# Patient Record
Sex: Female | Born: 1975 | Race: Black or African American | Hispanic: No | Marital: Single | Smoking: Never smoker
Health system: Southern US, Community
[De-identification: ages and names within clinical notes are randomized; demographics above are authoritative.]

## PROBLEM LIST (undated history)

## (undated) DIAGNOSIS — Z789 Other specified health status: Secondary | ICD-10-CM

## (undated) DIAGNOSIS — A630 Anogenital (venereal) warts: Secondary | ICD-10-CM

## (undated) DIAGNOSIS — D62 Acute posthemorrhagic anemia: Secondary | ICD-10-CM

## (undated) DIAGNOSIS — B019 Varicella without complication: Secondary | ICD-10-CM

## (undated) HISTORY — PX: NO PAST SURGERIES: SHX2092

## (undated) HISTORY — DX: Varicella without complication: B01.9

---

## 1999-05-11 ENCOUNTER — Other Ambulatory Visit: Admission: RE | Admit: 1999-05-11 | Discharge: 1999-05-11 | Payer: Self-pay | Admitting: Gynecology

## 2000-06-20 ENCOUNTER — Other Ambulatory Visit: Admission: RE | Admit: 2000-06-20 | Discharge: 2000-06-20 | Payer: Self-pay | Admitting: Emergency Medicine

## 2010-12-20 ENCOUNTER — Other Ambulatory Visit: Payer: Self-pay | Admitting: Family Medicine

## 2010-12-20 ENCOUNTER — Ambulatory Visit
Admission: RE | Admit: 2010-12-20 | Discharge: 2010-12-20 | Disposition: A | Discharge status: Home / Self Care | Payer: PRIVATE HEALTH INSURANCE | Source: Ambulatory Visit | Attending: Family Medicine | Admitting: Family Medicine

## 2010-12-20 DIAGNOSIS — E0590 Thyrotoxicosis, unspecified without thyrotoxic crisis or storm: Secondary | ICD-10-CM

## 2011-01-02 ENCOUNTER — Other Ambulatory Visit: Payer: Self-pay | Admitting: Internal Medicine

## 2011-01-02 DIAGNOSIS — E0590 Thyrotoxicosis, unspecified without thyrotoxic crisis or storm: Secondary | ICD-10-CM

## 2011-01-17 ENCOUNTER — Encounter (HOSPITAL_COMMUNITY)
Admission: RE | Admit: 2011-01-17 | Discharge: 2011-01-17 | Disposition: A | Discharge status: Home / Self Care | Payer: PRIVATE HEALTH INSURANCE | Source: Ambulatory Visit | Attending: Internal Medicine | Admitting: Internal Medicine

## 2011-01-17 DIAGNOSIS — E0590 Thyrotoxicosis, unspecified without thyrotoxic crisis or storm: Secondary | ICD-10-CM

## 2011-01-17 DIAGNOSIS — E0520 Thyrotoxicosis with toxic multinodular goiter without thyrotoxic crisis or storm: Secondary | ICD-10-CM | POA: Insufficient documentation

## 2011-01-18 ENCOUNTER — Encounter (HOSPITAL_COMMUNITY)
Admission: RE | Admit: 2011-01-18 | Discharge: 2011-01-18 | Disposition: A | Discharge status: Home / Self Care | Payer: PRIVATE HEALTH INSURANCE | Source: Ambulatory Visit | Attending: Internal Medicine | Admitting: Internal Medicine

## 2011-01-18 MED ORDER — SODIUM PERTECHNETATE TC 99M INJECTION
10.0000 | Freq: Once | INTRAVENOUS | Status: AC | PRN
  Administered 2011-01-18: 10 via INTRAVENOUS

## 2011-01-18 MED ORDER — SODIUM IODIDE I 131 CAPSULE
9.0000 | Freq: Once | INTRAVENOUS | Status: AC | PRN
  Administered 2011-01-18: 9 via ORAL

## 2014-04-24 LAB — OB RESULTS CONSOLE ABO/RH: RH TYPE: NEGATIVE

## 2014-04-24 LAB — OB RESULTS CONSOLE HIV ANTIBODY (ROUTINE TESTING): HIV: NONREACTIVE

## 2014-05-13 LAB — OB RESULTS CONSOLE GC/CHLAMYDIA
Chlamydia: NEGATIVE
GC PROBE AMP, GENITAL: NEGATIVE

## 2014-09-14 LAB — OB RESULTS CONSOLE ANTIBODY SCREEN: ANTIBODY SCREEN: NEGATIVE

## 2014-09-14 LAB — OB RESULTS CONSOLE HEPATITIS B SURFACE ANTIGEN: Hepatitis B Surface Ag: NEGATIVE

## 2014-09-14 LAB — OB RESULTS CONSOLE HIV ANTIBODY (ROUTINE TESTING)
HIV: NONREACTIVE
HIV: NONREACTIVE
HIV: NONREACTIVE

## 2014-09-14 LAB — OB RESULTS CONSOLE RPR
RPR: NONREACTIVE
RPR: NONREACTIVE
RPR: NONREACTIVE
RPR: NONREACTIVE

## 2014-09-14 LAB — OB RESULTS CONSOLE RUBELLA ANTIBODY, IGM: RUBELLA: IMMUNE

## 2014-10-26 LAB — OB RESULTS CONSOLE GBS: GBS: NEGATIVE

## 2014-11-26 LAB — OB RESULTS CONSOLE GBS: GBS: NEGATIVE

## 2014-11-30 ENCOUNTER — Encounter (HOSPITAL_COMMUNITY): Payer: Self-pay | Admitting: *Deleted

## 2014-11-30 ENCOUNTER — Inpatient Hospital Stay (HOSPITAL_COMMUNITY)
Admission: AD | Admit: 2014-11-30 | Discharge: 2014-12-04 | DRG: 765 | Disposition: A | Discharge status: Home / Self Care | Payer: BLUE CROSS/BLUE SHIELD | Source: Ambulatory Visit | Attending: Obstetrics and Gynecology | Admitting: Obstetrics and Gynecology

## 2014-11-30 ENCOUNTER — Inpatient Hospital Stay (HOSPITAL_COMMUNITY): Payer: BLUE CROSS/BLUE SHIELD | Admitting: Anesthesiology

## 2014-11-30 ENCOUNTER — Inpatient Hospital Stay (HOSPITAL_COMMUNITY)
Admission: AD | Admit: 2014-11-30 | Discharge: 2014-11-30 | Disposition: A | Discharge status: Home / Self Care | Payer: BLUE CROSS/BLUE SHIELD | Source: Ambulatory Visit | Attending: Obstetrics and Gynecology | Admitting: Obstetrics and Gynecology

## 2014-11-30 DIAGNOSIS — E860 Dehydration: Secondary | ICD-10-CM | POA: Diagnosis present

## 2014-11-30 DIAGNOSIS — O09513 Supervision of elderly primigravida, third trimester: Secondary | ICD-10-CM

## 2014-11-30 DIAGNOSIS — Z3A39 39 weeks gestation of pregnancy: Secondary | ICD-10-CM | POA: Diagnosis present

## 2014-11-30 DIAGNOSIS — O479 False labor, unspecified: Secondary | ICD-10-CM

## 2014-11-30 DIAGNOSIS — O26899 Other specified pregnancy related conditions, unspecified trimester: Secondary | ICD-10-CM | POA: Diagnosis present

## 2014-11-30 DIAGNOSIS — Z6791 Unspecified blood type, Rh negative: Secondary | ICD-10-CM

## 2014-11-30 DIAGNOSIS — O99284 Endocrine, nutritional and metabolic diseases complicating childbirth: Secondary | ICD-10-CM | POA: Diagnosis present

## 2014-11-30 DIAGNOSIS — O360930 Maternal care for other rhesus isoimmunization, third trimester, not applicable or unspecified: Secondary | ICD-10-CM | POA: Diagnosis present

## 2014-11-30 DIAGNOSIS — O9902 Anemia complicating childbirth: Secondary | ICD-10-CM | POA: Diagnosis not present

## 2014-11-30 DIAGNOSIS — Z3490 Encounter for supervision of normal pregnancy, unspecified, unspecified trimester: Secondary | ICD-10-CM

## 2014-11-30 DIAGNOSIS — D62 Acute posthemorrhagic anemia: Secondary | ICD-10-CM | POA: Diagnosis not present

## 2014-11-30 HISTORY — DX: Anogenital (venereal) warts: A63.0

## 2014-11-30 HISTORY — DX: Other specified health status: Z78.9

## 2014-11-30 HISTORY — DX: Acute posthemorrhagic anemia: D62

## 2014-11-30 LAB — POCT FERN TEST

## 2014-11-30 LAB — TYPE AND SCREEN
ABO/RH(D): B NEG
Antibody Screen: NEGATIVE

## 2014-11-30 LAB — CBC
HCT: 35.6 % — ABNORMAL LOW (ref 36.0–46.0)
Hemoglobin: 12.4 g/dL (ref 12.0–15.0)
MCH: 33.8 pg (ref 26.0–34.0)
MCHC: 34.8 g/dL (ref 30.0–36.0)
MCV: 97 fL (ref 78.0–100.0)
PLATELETS: 226 10*3/uL (ref 150–400)
RBC: 3.67 MIL/uL — AB (ref 3.87–5.11)
RDW: 13.4 % (ref 11.5–15.5)
WBC: 14.7 10*3/uL — AB (ref 4.0–10.5)

## 2014-11-30 LAB — ABO/RH: ABO/RH(D): B NEG

## 2014-11-30 MED ORDER — CITRIC ACID-SODIUM CITRATE 334-500 MG/5ML PO SOLN
30.0000 mL | ORAL | Status: DC | PRN
  Administered 2014-11-30 – 2014-12-01 (×2): 30 mL via ORAL
  Filled 2014-11-30 (×2): qty 15

## 2014-11-30 MED ORDER — LACTATED RINGERS IV SOLN
500.0000 mL | INTRAVENOUS | Status: DC | PRN
  Administered 2014-11-30: 1000 mL via INTRAVENOUS
  Administered 2014-11-30 – 2014-12-01 (×2): 500 mL via INTRAVENOUS

## 2014-11-30 MED ORDER — LIDOCAINE HCL (PF) 1 % IJ SOLN
INTRAMUSCULAR | Status: DC | PRN
  Administered 2014-11-30 (×2): 3 mL

## 2014-11-30 MED ORDER — TERBUTALINE SULFATE 1 MG/ML IJ SOLN: 0.2500 mg | Freq: Once | INTRAMUSCULAR | Status: AC | PRN

## 2014-11-30 MED ORDER — LACTATED RINGERS IV SOLN
INTRAVENOUS | Status: DC
  Administered 2014-11-30: 21:00:00 via INTRAUTERINE

## 2014-11-30 MED ORDER — PHENYLEPHRINE 40 MCG/ML (10ML) SYRINGE FOR IV PUSH (FOR BLOOD PRESSURE SUPPORT)
80.0000 ug | PREFILLED_SYRINGE | INTRAVENOUS | Status: DC | PRN
  Filled 2014-11-30 (×3): qty 20

## 2014-11-30 MED ORDER — LIDOCAINE HCL (PF) 1 % IJ SOLN: 30.0000 mL | INTRAMUSCULAR | Status: DC | PRN

## 2014-11-30 MED ORDER — ACETAMINOPHEN 325 MG PO TABS: 650.0000 mg | ORAL_TABLET | ORAL | Status: DC | PRN

## 2014-11-30 MED ORDER — DIPHENHYDRAMINE HCL 50 MG/ML IJ SOLN: 12.5000 mg | INTRAMUSCULAR | Status: DC | PRN

## 2014-11-30 MED ORDER — LACTATED RINGERS IV SOLN
INTRAVENOUS | Status: DC
  Administered 2014-11-30 – 2014-12-01 (×3): via INTRAVENOUS

## 2014-11-30 MED ORDER — FLEET ENEMA 7-19 GM/118ML RE ENEM: 1.0000 | ENEMA | Freq: Every day | RECTAL | Status: DC | PRN

## 2014-11-30 MED ORDER — SODIUM BICARBONATE 8.4 % IV SOLN
INTRAVENOUS | Status: DC | PRN
  Administered 2014-11-30: 3 mL via EPIDURAL
  Administered 2014-11-30: 7 mL via EPIDURAL
  Administered 2014-11-30: 5 mL via EPIDURAL
  Administered 2014-11-30: 4 mL via EPIDURAL
  Administered 2014-11-30 – 2014-12-01 (×3): 5 mL via EPIDURAL

## 2014-11-30 MED ORDER — ACETAMINOPHEN-CODEINE #3 300-30 MG PO TABS
2.0000 | ORAL_TABLET | Freq: Once | ORAL | Status: AC
  Administered 2014-11-30: 2 via ORAL
  Filled 2014-11-30: qty 2

## 2014-11-30 MED ORDER — FENTANYL 2.5 MCG/ML BUPIVACAINE 1/10 % EPIDURAL INFUSION (WH - ANES)
13.0000 mL/h | INTRAMUSCULAR | Status: DC | PRN
Start: 2014-11-30 — End: 2014-12-01
  Administered 2014-11-30 (×4): 13 mL/h via EPIDURAL
  Filled 2014-11-30 (×2): qty 125

## 2014-11-30 MED ORDER — LACTATED RINGERS IV BOLUS (SEPSIS): 1000.0000 mL | Freq: Once | INTRAVENOUS | Status: DC

## 2014-11-30 MED ORDER — OXYCODONE-ACETAMINOPHEN 5-325 MG PO TABS: 1.0000 | ORAL_TABLET | ORAL | Status: DC | PRN

## 2014-11-30 MED ORDER — FENTANYL 2.5 MCG/ML BUPIVACAINE 1/10 % EPIDURAL INFUSION (WH - ANES)
14.0000 mL/h | INTRAMUSCULAR | Status: DC | PRN
  Filled 2014-11-30: qty 125

## 2014-11-30 MED ORDER — OXYTOCIN 40 UNITS IN LACTATED RINGERS INFUSION - SIMPLE MED: 62.5000 mL/h | INTRAVENOUS | Status: DC

## 2014-11-30 MED ORDER — EPHEDRINE 5 MG/ML INJ: 10.0000 mg | INTRAVENOUS | Status: DC | PRN

## 2014-11-30 MED ORDER — OXYTOCIN 40 UNITS IN LACTATED RINGERS INFUSION - SIMPLE MED
1.0000 m[IU]/min | INTRAVENOUS | Status: DC
Start: 2014-11-30 — End: 2014-12-01
  Administered 2014-11-30: 2 m[IU]/min via INTRAVENOUS
  Administered 2014-11-30: 4 m[IU]/min via INTRAVENOUS
  Administered 2014-11-30: 12 m[IU]/min via INTRAVENOUS
  Filled 2014-11-30: qty 1000

## 2014-11-30 MED ORDER — PHENYLEPHRINE 40 MCG/ML (10ML) SYRINGE FOR IV PUSH (FOR BLOOD PRESSURE SUPPORT)
80.0000 ug | PREFILLED_SYRINGE | INTRAVENOUS | Status: AC | PRN
  Administered 2014-11-30 – 2014-12-01 (×6): 80 ug via INTRAVENOUS

## 2014-11-30 MED ORDER — OXYTOCIN BOLUS FROM INFUSION: 500.0000 mL | INTRAVENOUS | Status: DC

## 2014-11-30 MED ORDER — OXYCODONE-ACETAMINOPHEN 5-325 MG PO TABS: 2.0000 | ORAL_TABLET | ORAL | Status: DC | PRN

## 2014-11-30 MED ORDER — ONDANSETRON HCL 4 MG/2ML IJ SOLN
4.0000 mg | Freq: Four times a day (QID) | INTRAMUSCULAR | Status: DC | PRN
  Administered 2014-11-30 – 2014-12-01 (×2): 4 mg via INTRAVENOUS
  Filled 2014-11-30: qty 2

## 2014-11-30 NOTE — Discharge Instructions (Signed)

## 2014-11-30 NOTE — Anesthesia Procedure Notes (Addendum)
Epidural Patient location during procedure: OB Start time: 11/30/2014 11:07 AM  Staffing Anesthesiologist: Mal Amabile Performed by: anesthesiologist   Preanesthetic Checklist Completed: patient identified, site marked, surgical consent, pre-op evaluation, timeout performed, IV checked, risks and benefits discussed and monitors and equipment checked  Epidural Patient position: sitting Prep: site prepped and draped and DuraPrep Patient monitoring: continuous pulse ox and blood pressure Approach: midline Location: L3-L4 Injection technique: LOR air  Needle:  Needle type: Tuohy  Needle gauge: 17 G Needle length: 9 cm and 9 Needle insertion depth: 5 cm cm Catheter type: closed end flexible Catheter size: 19 Gauge Catheter at skin depth: 10 cm Test dose: negative and Other  Assessment Events: blood not aspirated, injection not painful, no injection resistance, negative IV test and no paresthesia  Additional Notes Patient identified. Risks and benefits discussed including failed block, incomplete  Pain control, post dural puncture headache, nerve damage, paralysis, blood pressure Changes, nausea, vomiting, reactions to medications-both toxic and allergic and post Partum back pain. All questions were answered. Patient expressed understanding and wished to proceed. Sterile technique was used throughout procedure. Epidural site was Dressed with sterile barrier dressing. No paresthesias, signs of intravascular injection Or signs of intrathecal spread were encountered.  Patient was more comfortable after the epidural was dosed. Please see RN's note for documentation of vital signs and FHR which are stable.   Epidural Patient location during procedure: OB Start time: 11/30/2014 7:40 PM  Staffing Anesthesiologist: Mal Amabile Performed by: anesthesiologist   Preanesthetic Checklist Completed: patient identified, site marked, surgical consent, pre-op evaluation, timeout  performed, IV checked, risks and benefits discussed and monitors and equipment checked  Epidural Patient position: sitting Prep: site prepped and draped and DuraPrep Patient monitoring: continuous pulse ox and blood pressure Approach: midline Location: L4-L5 Injection technique: LOR air  Needle:  Needle type: Tuohy  Needle gauge: 17 G Needle length: 9 cm and 9 Needle insertion depth: 6 cm Catheter type: closed end flexible Catheter size: 19 Gauge Catheter at skin depth: 11 cm Test dose: negative and Other  Assessment Events: blood not aspirated, injection not painful, no injection resistance, negative IV test and no paresthesia  Additional Notes Patient identified. Risks and benefits discussed including failed block, incomplete  Pain control, post dural puncture headache, nerve damage, paralysis, blood pressure Changes, nausea, vomiting, reactions to medications-both toxic and allergic and post Partum back pain. All questions were answered. Patient expressed understanding and wished to proceed. Sterile technique was used throughout procedure. Epidural site was Dressed with sterile barrier dressing. No paresthesias, signs of intravascular injection Or signs of intrathecal spread were encountered.  Patient was more comfortable after the epidural was dosed. Please see RN's note for documentation of vital signs and FHR which are stable.

## 2014-11-30 NOTE — Progress Notes (Signed)
S; epidural re-dosed Pt more comfortable   O:  BP 83/61 FHR deceleration noted down to 70's -80's x 4 mins . Baseline 135 Maternal positional change and mat O2 given  VE loose 4/edemeatous ant lip/-2  ? caput  Bedside sono vtx AROM clear fluid ISE/IUPC placed  IMP: Arrest of dilation 2nd to suboptimal ctx Dehydration Term gestation P) left exaggerated sims. Cont pitocin. Watch tracing closely. May need amnioinfusion

## 2014-11-30 NOTE — Progress Notes (Signed)
Juliane Lack CRNA at bedside to evaluate pt

## 2014-11-30 NOTE — Progress Notes (Signed)
Epidural redosed with 3 cc of 2% Lidocaine

## 2014-11-30 NOTE — Progress Notes (Signed)
Epidural redosed 

## 2014-11-30 NOTE — H&P (Signed)
Renee Drake is a 39 y.o. female presenting @ term in labor. Intact membrane. GBS cx neg Came from MAU where she was evaluated for ctx/labor History OB History    Gravida Para Term Preterm AB TAB SAB Ectopic Multiple Living   1 0 0       0     Past Medical History  Diagnosis Date  . Medical history non-contributory   . HPV (human papilloma virus) anogenital infection    Past Surgical History  Procedure Laterality Date  . No past surgeries     Family History: family history includes Diabetes in her mother. Social History:  reports that she has never smoked. She has never used smokeless tobacco. She reports that she does not drink alcohol or use illicit drugs.   Prenatal Transfer Tool  Maternal Diabetes: No Genetic Screening: Normal Maternal Ultrasounds/Referrals: Normal Fetal Ultrasounds or other Referrals:  None Maternal Substance Abuse:  No Significant Maternal Medications:  None Significant Maternal Lab Results:  Lab values include: Group B Strep negative, Rh negative Other Comments:  None  Review of Systems  All other systems reviewed and are negative.   Dilation: 4 Exam by:: s grindsstaff rn Blood pressure 112/60, pulse 87, temperature 97.5 F (36.4 C), temperature source Oral, resp. rate 17, height 5\' 3"  (1.6 m), weight 81.194 kg (179 lb), SpO2 100 %. Maternal Exam:  Uterine Assessment: Contraction strength is mild.  Abdomen: Patient reports no abdominal tenderness.   Physical Exam  Constitutional: She is oriented to person, place, and time. She appears well-developed and well-nourished.  HENT:  Head: Atraumatic.  Eyes: EOM are normal.  Neck: Neck supple.  Cardiovascular: Normal rate.   Respiratory: Breath sounds normal.  GI: Soft.  Neurological: She is alert and oriented to person, place, and time.  Skin: Skin is warm and dry.  Psychiatric: She has a normal mood and affect.   VE 4/90/-3/-2 intact  Tracing: baseline 150 (+) accel  Ctx q 4-6 mins    Prenatal labs: ABO, Rh: --/--/B NEG, B NEG (06/13 1035) Antibody: NEG (06/13 1035) Rubella: Immune (03/28 0000) RPR: Nonreactive, Nonreactive, Nonreactive, Nonreactive (03/28 0000)  HBsAg: Negative (03/28 0000)  HIV: Non-reactive, Non-reactive, Non-reactive (03/28 0000)  GBS: Negative (06/09 0000)   Assessment/Plan  Early labor Rh negative Term gestation P) admit routine labs Epidural amniotomy IVF, pitocin augmentation. rhophylac prn  Renee Drake A 11/30/2014, 1:40 PM

## 2014-11-30 NOTE — Progress Notes (Signed)
Valora Corporal, RROB RN at bedside to monitor baby during epidural placement.

## 2014-11-30 NOTE — Progress Notes (Signed)
Patient desires to sit in high fowlers due to reflux and requests meds, oracit given, see MAR

## 2014-11-30 NOTE — Progress Notes (Signed)
Tracing reviewed: Deep variable decels noted in otherwise tracing with accels  IMP: variable deceleration due to cord compression P) start amnioinfusion Cont pitocin

## 2014-11-30 NOTE — Progress Notes (Signed)
Bladder scan 0 cc of urine in bladder Pt also placed on bedpan.  Pt unable to void

## 2014-11-30 NOTE — Progress Notes (Signed)
Pt in pain after three doses of PCEA. Dr Arby Barrette and CRNA notified.  CRNA will be coming to room to evalutate pt.  Pt alos c/o feeling like she needs to "pee"  Balloon on catheter deflated and inserted further into bladder.  Appriox 150 cc of urine in foley bag

## 2014-11-30 NOTE — MAU Note (Signed)
Pt reports she has been contracting since Thursday but this morning they are back to back. reports having some bloody show and has lost her mucus plug.Good fetal movement reported.

## 2014-11-30 NOTE — Anesthesia Preprocedure Evaluation (Addendum)
Anesthesia Evaluation  Patient identified by MRN, date of birth, ID band Patient awake    Reviewed: Allergy & Precautions, NPO status , Patient's Chart, lab work & pertinent test results  Airway Mallampati: III  TM Distance: >3 FB Neck ROM: Full    Dental no notable dental hx. (+) Teeth Intact   Pulmonary neg pulmonary ROS,  breath sounds clear to auscultation  Pulmonary exam normal       Cardiovascular negative cardio ROS Normal cardiovascular examRhythm:Regular Rate:Normal     Neuro/Psych negative neurological ROS  negative psych ROS   GI/Hepatic Neg liver ROS, GERD-  Medicated and Controlled,  Endo/Other  Obesity  Renal/GU negative Renal ROS  negative genitourinary   Musculoskeletal negative musculoskeletal ROS (+)   Abdominal (+) + obese,   Peds  Hematology   Anesthesia Other Findings   Reproductive/Obstetrics (+) Pregnancy HPV                             Anesthesia Physical Anesthesia Plan  ASA: II and emergent  Anesthesia Plan: Epidural   Post-op Pain Management:    Induction:   Airway Management Planned: Natural Airway  Additional Equipment:   Intra-op Plan:   Post-operative Plan:   Informed Consent: I have reviewed the patients History and Physical, chart, labs and discussed the procedure including the risks, benefits and alternatives for the proposed anesthesia with the patient or authorized representative who has indicated his/her understanding and acceptance.   Dental advisory given  Plan Discussed with: Anesthesiologist, CRNA and Surgeon  Anesthesia Plan Comments: (Patient for urgent C/Section for non reassuring FHR tracing. Will use epidural for C/Section. M. Juwan Vences,MD)       Anesthesia Quick Evaluation

## 2014-12-01 ENCOUNTER — Encounter (HOSPITAL_COMMUNITY)
Admission: AD | Disposition: A | Discharge status: Home / Self Care | Payer: Self-pay | Source: Ambulatory Visit | Attending: Obstetrics and Gynecology

## 2014-12-01 ENCOUNTER — Encounter (HOSPITAL_COMMUNITY): Payer: Self-pay | Admitting: *Deleted

## 2014-12-01 LAB — RPR: RPR Ser Ql: NONREACTIVE

## 2014-12-01 SURGERY — Surgical Case
Anesthesia: Epidural | Site: Abdomen

## 2014-12-01 MED ORDER — MORPHINE SULFATE (PF) 0.5 MG/ML IJ SOLN
INTRAMUSCULAR | Status: DC | PRN
  Administered 2014-12-01: 4 mg via EPIDURAL

## 2014-12-01 MED ORDER — OXYCODONE-ACETAMINOPHEN 5-325 MG PO TABS
2.0000 | ORAL_TABLET | ORAL | Status: DC | PRN
Start: 2014-12-01 — End: 2014-12-04

## 2014-12-01 MED ORDER — METHYLERGONOVINE MALEATE 0.2 MG/ML IJ SOLN: 0.2000 mg | INTRAMUSCULAR | Status: DC | PRN

## 2014-12-01 MED ORDER — OXYTOCIN 10 UNIT/ML IJ SOLN
40.0000 [IU] | INTRAVENOUS | Status: DC | PRN
  Administered 2014-12-01: 40 [IU] via INTRAVENOUS

## 2014-12-01 MED ORDER — SIMETHICONE 80 MG PO CHEW
80.0000 mg | CHEWABLE_TABLET | Freq: Three times a day (TID) | ORAL | Status: DC
  Administered 2014-12-01 – 2014-12-04 (×9): 80 mg via ORAL
  Filled 2014-12-01 (×10): qty 1

## 2014-12-01 MED ORDER — BUPIVACAINE HCL (PF) 0.25 % IJ SOLN
INTRAMUSCULAR | Status: AC
  Filled 2014-12-01: qty 30

## 2014-12-01 MED ORDER — LANOLIN HYDROUS EX OINT: 1.0000 "application " | TOPICAL_OINTMENT | CUTANEOUS | Status: DC | PRN

## 2014-12-01 MED ORDER — PHENYLEPHRINE 40 MCG/ML (10ML) SYRINGE FOR IV PUSH (FOR BLOOD PRESSURE SUPPORT)
PREFILLED_SYRINGE | INTRAVENOUS | Status: AC
  Filled 2014-12-01: qty 30

## 2014-12-01 MED ORDER — NALBUPHINE HCL 10 MG/ML IJ SOLN: 5.0000 mg | INTRAMUSCULAR | Status: DC | PRN

## 2014-12-01 MED ORDER — NALOXONE HCL 0.4 MG/ML IJ SOLN: 0.4000 mg | INTRAMUSCULAR | Status: DC | PRN

## 2014-12-01 MED ORDER — SCOPOLAMINE 1 MG/3DAYS TD PT72
MEDICATED_PATCH | TRANSDERMAL | Status: AC
  Filled 2014-12-01: qty 1

## 2014-12-01 MED ORDER — METHYLERGONOVINE MALEATE 0.2 MG/ML IJ SOLN
INTRAMUSCULAR | Status: DC | PRN
  Administered 2014-12-01: 0.2 mg via INTRAMUSCULAR

## 2014-12-01 MED ORDER — DEXAMETHASONE SODIUM PHOSPHATE 4 MG/ML IJ SOLN
INTRAMUSCULAR | Status: AC
  Filled 2014-12-01: qty 1

## 2014-12-01 MED ORDER — DIPHENHYDRAMINE HCL 25 MG PO CAPS: 25.0000 mg | ORAL_CAPSULE | ORAL | Status: DC | PRN

## 2014-12-01 MED ORDER — SODIUM CHLORIDE 0.9 % IJ SOLN: 3.0000 mL | INTRAMUSCULAR | Status: DC | PRN

## 2014-12-01 MED ORDER — NALBUPHINE HCL 10 MG/ML IJ SOLN: 5.0000 mg | Freq: Once | INTRAMUSCULAR | Status: AC | PRN

## 2014-12-01 MED ORDER — OXYTOCIN 10 UNIT/ML IJ SOLN
INTRAMUSCULAR | Status: AC
  Filled 2014-12-01: qty 4

## 2014-12-01 MED ORDER — KETOROLAC TROMETHAMINE 30 MG/ML IJ SOLN
30.0000 mg | Freq: Four times a day (QID) | INTRAMUSCULAR | Status: AC | PRN
  Administered 2014-12-01: 30 mg via INTRAVENOUS

## 2014-12-01 MED ORDER — SODIUM CHLORIDE 0.9 % IV SOLN: 250.0000 mL | INTRAVENOUS | Status: DC

## 2014-12-01 MED ORDER — ONDANSETRON HCL 4 MG/2ML IJ SOLN
INTRAMUSCULAR | Status: AC
  Filled 2014-12-01: qty 4

## 2014-12-01 MED ORDER — 0.9 % SODIUM CHLORIDE (POUR BTL) OPTIME
TOPICAL | Status: DC | PRN
  Administered 2014-12-01: 1000 mL

## 2014-12-01 MED ORDER — ONDANSETRON HCL 4 MG/2ML IJ SOLN: 4.0000 mg | Freq: Three times a day (TID) | INTRAMUSCULAR | Status: DC | PRN

## 2014-12-01 MED ORDER — BUPIVACAINE HCL (PF) 0.25 % IJ SOLN
INTRAMUSCULAR | Status: DC | PRN
  Administered 2014-12-01: 10 mL

## 2014-12-01 MED ORDER — SCOPOLAMINE 1 MG/3DAYS TD PT72
MEDICATED_PATCH | TRANSDERMAL | Status: DC | PRN
  Administered 2014-12-01: 1 via TRANSDERMAL

## 2014-12-01 MED ORDER — ACETAMINOPHEN 325 MG PO TABS: 650.0000 mg | ORAL_TABLET | ORAL | Status: DC | PRN

## 2014-12-01 MED ORDER — DIPHENHYDRAMINE HCL 25 MG PO CAPS: 25.0000 mg | ORAL_CAPSULE | Freq: Four times a day (QID) | ORAL | Status: DC | PRN

## 2014-12-01 MED ORDER — IBUPROFEN 600 MG PO TABS
600.0000 mg | ORAL_TABLET | Freq: Four times a day (QID) | ORAL | Status: DC
  Administered 2014-12-02 – 2014-12-04 (×10): 600 mg via ORAL
  Filled 2014-12-01 (×12): qty 1

## 2014-12-01 MED ORDER — LACTATED RINGERS IV SOLN
INTRAVENOUS | Status: DC
  Administered 2014-12-01: 125 mL/h via INTRAVENOUS

## 2014-12-01 MED ORDER — KETOROLAC TROMETHAMINE 30 MG/ML IJ SOLN
INTRAMUSCULAR | Status: AC
  Administered 2014-12-01: 30 mg via INTRAVENOUS
  Filled 2014-12-01: qty 1

## 2014-12-01 MED ORDER — NALBUPHINE HCL 10 MG/ML IJ SOLN
5.0000 mg | Freq: Once | INTRAMUSCULAR | Status: AC | PRN
Start: 2014-12-01 — End: 2014-12-01

## 2014-12-01 MED ORDER — CEFAZOLIN SODIUM-DEXTROSE 2-3 GM-% IV SOLR
2.0000 g | INTRAVENOUS | Status: DC
  Filled 2014-12-01: qty 50

## 2014-12-01 MED ORDER — CEFAZOLIN SODIUM-DEXTROSE 2-3 GM-% IV SOLR
INTRAVENOUS | Status: DC | PRN
  Administered 2014-12-01: 2 g via INTRAVENOUS

## 2014-12-01 MED ORDER — SENNOSIDES-DOCUSATE SODIUM 8.6-50 MG PO TABS
2.0000 | ORAL_TABLET | ORAL | Status: DC
  Administered 2014-12-02 – 2014-12-03 (×3): 2 via ORAL
  Filled 2014-12-01 (×3): qty 2

## 2014-12-01 MED ORDER — WITCH HAZEL-GLYCERIN EX PADS: 1.0000 "application " | MEDICATED_PAD | CUTANEOUS | Status: DC | PRN

## 2014-12-01 MED ORDER — PRENATAL MULTIVITAMIN CH
1.0000 | ORAL_TABLET | Freq: Every day | ORAL | Status: DC
  Administered 2014-12-02 – 2014-12-03 (×2): 1 via ORAL
  Filled 2014-12-01 (×3): qty 1

## 2014-12-01 MED ORDER — FLEET ENEMA 7-19 GM/118ML RE ENEM: 1.0000 | ENEMA | Freq: Every day | RECTAL | Status: DC | PRN

## 2014-12-01 MED ORDER — ZOLPIDEM TARTRATE 5 MG PO TABS: 5.0000 mg | ORAL_TABLET | Freq: Every evening | ORAL | Status: DC | PRN

## 2014-12-01 MED ORDER — SODIUM CHLORIDE 0.9 % IJ SOLN: 3.0000 mL | Freq: Two times a day (BID) | INTRAMUSCULAR | Status: DC

## 2014-12-01 MED ORDER — SIMETHICONE 80 MG PO CHEW
80.0000 mg | CHEWABLE_TABLET | ORAL | Status: DC
  Administered 2014-12-02 – 2014-12-03 (×3): 80 mg via ORAL
  Filled 2014-12-01 (×3): qty 1

## 2014-12-01 MED ORDER — MENTHOL 3 MG MT LOZG: 1.0000 | LOZENGE | OROMUCOSAL | Status: DC | PRN

## 2014-12-01 MED ORDER — OXYCODONE-ACETAMINOPHEN 5-325 MG PO TABS
1.0000 | ORAL_TABLET | ORAL | Status: DC | PRN
  Administered 2014-12-03 (×2): 1 via ORAL
  Filled 2014-12-01 (×4): qty 1

## 2014-12-01 MED ORDER — IBUPROFEN 600 MG PO TABS
600.0000 mg | ORAL_TABLET | Freq: Four times a day (QID) | ORAL | Status: DC | PRN
  Administered 2014-12-01: 600 mg via ORAL

## 2014-12-01 MED ORDER — SIMETHICONE 80 MG PO CHEW: 80.0000 mg | CHEWABLE_TABLET | ORAL | Status: DC | PRN

## 2014-12-01 MED ORDER — OXYTOCIN 40 UNITS IN LACTATED RINGERS INFUSION - SIMPLE MED: 62.5000 mL/h | INTRAVENOUS | Status: AC

## 2014-12-01 MED ORDER — KETOROLAC TROMETHAMINE 30 MG/ML IJ SOLN: 30.0000 mg | Freq: Four times a day (QID) | INTRAMUSCULAR | Status: AC | PRN

## 2014-12-01 MED ORDER — DIBUCAINE 1 % RE OINT: 1.0000 "application " | TOPICAL_OINTMENT | RECTAL | Status: DC | PRN

## 2014-12-01 MED ORDER — DEXAMETHASONE SODIUM PHOSPHATE 4 MG/ML IJ SOLN
INTRAMUSCULAR | Status: DC | PRN
  Administered 2014-12-01: 4 mg via INTRAVENOUS

## 2014-12-01 MED ORDER — FENTANYL CITRATE (PF) 100 MCG/2ML IJ SOLN: 25.0000 ug | INTRAMUSCULAR | Status: DC | PRN

## 2014-12-01 MED ORDER — MEPERIDINE HCL 25 MG/ML IJ SOLN: 6.2500 mg | INTRAMUSCULAR | Status: DC | PRN

## 2014-12-01 MED ORDER — BISACODYL 10 MG RE SUPP: 10.0000 mg | Freq: Every day | RECTAL | Status: DC | PRN

## 2014-12-01 MED ORDER — NALOXONE HCL 1 MG/ML IJ SOLN
1.0000 ug/kg/h | INTRAVENOUS | Status: DC | PRN
  Filled 2014-12-01: qty 2

## 2014-12-01 MED ORDER — MORPHINE SULFATE 0.5 MG/ML IJ SOLN
INTRAMUSCULAR | Status: AC
  Filled 2014-12-01: qty 10

## 2014-12-01 MED ORDER — FERROUS SULFATE 325 (65 FE) MG PO TABS
325.0000 mg | ORAL_TABLET | Freq: Two times a day (BID) | ORAL | Status: DC
  Administered 2014-12-01 – 2014-12-04 (×6): 325 mg via ORAL
  Filled 2014-12-01 (×8): qty 1

## 2014-12-01 MED ORDER — METHYLERGONOVINE MALEATE 0.2 MG PO TABS: 0.2000 mg | ORAL_TABLET | ORAL | Status: DC | PRN

## 2014-12-01 MED ORDER — CEFAZOLIN SODIUM-DEXTROSE 2-3 GM-% IV SOLR
INTRAVENOUS | Status: AC
  Filled 2014-12-01: qty 50

## 2014-12-01 MED ORDER — METHYLERGONOVINE MALEATE 0.2 MG/ML IJ SOLN
INTRAMUSCULAR | Status: AC
  Filled 2014-12-01: qty 1

## 2014-12-01 MED ORDER — DIPHENHYDRAMINE HCL 50 MG/ML IJ SOLN: 12.5000 mg | INTRAMUSCULAR | Status: DC | PRN

## 2014-12-01 SURGICAL SUPPLY — 42 items
BARRIER ADHS 3X4 INTERCEED (GAUZE/BANDAGES/DRESSINGS) ×3 IMPLANT
BENZOIN TINCTURE PRP APPL 2/3 (GAUZE/BANDAGES/DRESSINGS) IMPLANT
CLAMP CORD UMBIL (MISCELLANEOUS) IMPLANT
CLOSURE WOUND 1/2 X4 (GAUZE/BANDAGES/DRESSINGS)
CLOTH BEACON ORANGE TIMEOUT ST (SAFETY) ×3 IMPLANT
CONTAINER PREFILL 10% NBF 15ML (MISCELLANEOUS) IMPLANT
DRAPE C SECTION CLR SCREEN (DRAPES) ×6 IMPLANT
DRAPE SHEET LG 3/4 BI-LAMINATE (DRAPES) ×3 IMPLANT
DRSG OPSITE POSTOP 4X10 (GAUZE/BANDAGES/DRESSINGS) ×3 IMPLANT
DURAPREP 26ML APPLICATOR (WOUND CARE) ×3 IMPLANT
ELECT REM PT RETURN 9FT ADLT (ELECTROSURGICAL) ×3
ELECTRODE REM PT RTRN 9FT ADLT (ELECTROSURGICAL) ×1 IMPLANT
EXTRACTOR VACUUM M CUP 4 TUBE (SUCTIONS) IMPLANT
EXTRACTOR VACUUM M CUP 4' TUBE (SUCTIONS)
GLOVE BIOGEL PI IND STRL 7.0 (GLOVE) ×1 IMPLANT
GLOVE BIOGEL PI INDICATOR 7.0 (GLOVE) ×2
GLOVE ECLIPSE 6.5 STRL STRAW (GLOVE) ×3 IMPLANT
GOWN STRL REUS W/TWL LRG LVL3 (GOWN DISPOSABLE) ×6 IMPLANT
KIT ABG SYR 3ML LUER SLIP (SYRINGE) ×3 IMPLANT
NEEDLE HYPO 22GX1.5 SAFETY (NEEDLE) ×3 IMPLANT
NEEDLE HYPO 25X5/8 SAFETYGLIDE (NEEDLE) ×3 IMPLANT
NS IRRIG 1000ML POUR BTL (IV SOLUTION) ×3 IMPLANT
PACK C SECTION WH (CUSTOM PROCEDURE TRAY) ×3 IMPLANT
PAD OB MATERNITY 4.3X12.25 (PERSONAL CARE ITEMS) ×3 IMPLANT
RTRCTR C-SECT PINK 25CM LRG (MISCELLANEOUS) ×3 IMPLANT
STAPLER VISISTAT 35W (STAPLE) ×3 IMPLANT
STRIP CLOSURE SKIN 1/2X4 (GAUZE/BANDAGES/DRESSINGS) IMPLANT
SUT CHROMIC GUT AB #0 18 (SUTURE) IMPLANT
SUT MNCRL 0 VIOLET CTX 36 (SUTURE) ×3 IMPLANT
SUT MON AB 4-0 PS1 27 (SUTURE) IMPLANT
SUT MONOCRYL 0 CTX 36 (SUTURE) ×6
SUT PLAIN 2 0 (SUTURE)
SUT PLAIN 2 0 XLH (SUTURE) ×3 IMPLANT
SUT PLAIN ABS 2-0 CT1 27XMFL (SUTURE) IMPLANT
SUT VIC AB 0 CT1 36 (SUTURE) ×6 IMPLANT
SUT VIC AB 2-0 CT1 27 (SUTURE) ×4
SUT VIC AB 2-0 CT1 TAPERPNT 27 (SUTURE) ×2 IMPLANT
SUT VIC AB 4-0 PS2 27 (SUTURE) IMPLANT
SYR BULB 3OZ (MISCELLANEOUS) ×3 IMPLANT
SYR CONTROL 10ML LL (SYRINGE) ×3 IMPLANT
TOWEL OR 17X24 6PK STRL BLUE (TOWEL DISPOSABLE) ×3 IMPLANT
TRAY FOLEY CATH SILVER 14FR (SET/KITS/TRAYS/PACK) IMPLANT

## 2014-12-01 NOTE — Transfer of Care (Signed)
Immediate Anesthesia Transfer of Care Note  Patient: Renee Drake  Procedure(s) Performed: Procedure(s): CESAREAN SECTION (N/A)  Patient Location: PACU  Anesthesia Type:Epidural  Level of Consciousness: awake, alert  and oriented  Airway & Oxygen Therapy: Patient Spontanous Breathing  Post-op Assessment: Report given to RN and Post -op Vital signs reviewed and stable  Post vital signs: Reviewed and stable  Last Vitals:  Filed Vitals:   12/01/14 0144  BP: 133/80  Pulse: 107  Temp: 36.8 C  Resp: 20    Complications: No apparent anesthesia complications

## 2014-12-01 NOTE — Progress Notes (Signed)
S; comfortable  O: pitocin Epidural VE per RN:  8/90/-1 Tracing baseline 145 (+) accel Repetitive lates, variables noted Last seen when pt was on left side which is the usual side for tracing issues Pt was flipped back to right but cont to have deceleration unlike in the past when  Tracing concerns usually resolved Ctx q 2  mins  IMP: fetal intolerance to labor Term gestation P) issue explained to pt. Recommend C/S.  risk of C/S reviewed: infection, bleeding, injury to bladder, bowels, ureter. poss blood transfusion and its risk, internal scar tissue. All ? Answered. Consent signed OR notified

## 2014-12-01 NOTE — Op Note (Signed)
NAME:  Renee Drake, Renee Drake           ACCOUNT NO.:  [id number]  MEDICAL RECORD NO.:  [id number]  LOCATION:  [facility]                          FACILITY:  [facility]  PHYSICIAN:  Maxie Better, M.D.DATE OF BIRTH:  June 04, 1976  DATE OF PROCEDURE:  12/01/2014 DATE OF DISCHARGE:                              OPERATIVE REPORT   PREOPERATIVE DIAGNOSIS:  Fetal intolerance to labor, term gestation.  PROCEDURE:  Primary cesarean section, Kerr hysterotomy.  POSTOPERATIVE DIAGNOSIS:  Fetal intolerance to labor, term gestation, direct occiput posterior presentation.  ANESTHESIA:  Epidural.  SURGEON:  Maxie Better, M.D.  ASSISTANT:  Donette Larry, CNM  DESCRIPTION OF PROCEDURE:  Under adequate epidural anesthesia, the patient was placed in the supine position with a left lateral tilt.  She was sterilely prepped and draped in usual fashion.  An indwelling Foley catheter was already in place, draining concentrated urine.  A 0.25% Marcaine was injected along the planned Pfannenstiel skin incision site. Pfannenstiel skin incision was then made, carried down to the rectus fascia.  The rectus fascia was opened transversely.  Rectus fascia was then bluntly and sharply dissected off the rectus muscle in superior and inferior fashion.  The rectus muscle was split in midline.  The parietal peritoneum was entered bluntly and extended.  Copious serous-colored fluid was noted in the abdomen.  The parietal peritoneum was extended superiorly.  The Alexis self-retaining retractor was then placed. Vesicouterine peritoneum was opened transversely.  The bladder was bluntly dissected off the lower uterine segment and displaced inferiorly. A curvilinear low-transverse uterine incision was then made and extended with bandage scissors.  Subsequent delivery of a live female from the right occiput posterior presentation with a hyperextended head was accomplished.  Cord around the right foot was noted.  The baby was  bulb suctioned on the abdomen.  Cord was clamped and cut.  The baby was transferred to the awaiting pediatrician, who assigned Apgars of 9 and 9 at 1 and 5 minutes respectively.  The placenta, which was anterior manually removed, sent to Pathology.  Uterine cavity was cleaned of debris.  Uterine incision had no extension, was closed in two layers, the first layer with 0 Monocryl in running locked stitch, second layer was imbricated using 0 Monocryl suture, bleeding in the midline with figure-of-eight 0 Monocryl suture.  Normal tubes and ovaries were noted bilaterally.  The abdomen was irrigated and suctioned of debris.  The Interceed was placed on the lower uterine segment in an inverted T- fashion.  The Alexis retractor was removed.  The parietal peritoneum was closed with 2-0 Vicryl.  The rectus fascia was closed with 0 Vicryl x2. The subcutaneous area was irrigated, small bleeder was cauterized. Interrupted 2-0 plain sutures were placed and the skin approximated with Ethicon staples.  SPECIMENS:  Placenta sent to Pathology.  ESTIMATED BLOOD LOSS:  1100 mL.  INTRAOPERATIVE FLUID:  2200 mL.  URINE OUT:  150 mL, slightly tinged urine.  COUNTS:  Sponge and instrument counts x2 were correct.  COMPLICATION:  None.  The patient tolerated the procedure well, was transferred to the recovery room in stable condition.  The baby was placed skin to skin.     Maxie Better, M.D.  [place]/MEDQ  D:  12/01/2014  T:  12/01/2014  Job:  [id number]

## 2014-12-01 NOTE — Lactation Note (Signed)
This note was copied from the chart of Renee Rocklynn Wanamaker. Lactation Consultation Note Initial visit at 20 hours of age.  Baby has not been feeding well during 1st day of life and had circ done today as well.  Baby STS with mom, attempted latch.  Baby will suck on gloved finger in short burst but does not show feeding interst at this time.   Mom has very large breasts with everted nipples.  Mom noted colostrum earlier, but after several minutes of hand expression no colostrum visible.  Encouraged mom to continue trying and keep baby STS as often as she can. [facility] LC resources given and discussed.  Encouraged to feed with early cues on demand.  Early newborn behavior discussed.  May consider pumping if baby doesn't begin to latch and after weight check is evaluated.   Mom to call for assist as needed.     Patient Name: Renee Drake HUTML'Y Date: 12/01/2014 Reason for consult: Initial assessment   Maternal Data Has patient been taught Hand Expression?: Yes Does the patient have breastfeeding experience prior to this delivery?: No  Feeding Feeding Type: Breast Fed Length of feed:  (mouthed breast)  LATCH Score/Interventions Latch: Too sleepy or reluctant, no latch achieved, no sucking elicited. Intervention(s): Skin to skin;Teach feeding cues;Waking techniques  Audible Swallowing: None  Type of Nipple: Everted at rest and after stimulation  Comfort (Breast/Nipple): Soft / non-tender     Hold (Positioning): Assistance needed to correctly position infant at breast and maintain latch. Intervention(s): Breastfeeding basics reviewed;Support Pillows;Position options;Skin to skin  LATCH Score: 5  Lactation Tools Discussed/Used     Consult Status Consult Status: Follow-up Date: 12/02/14 Follow-up type: In-patient    Jannifer Rodney 12/01/2014, 10:29 PM

## 2014-12-01 NOTE — Progress Notes (Signed)
In O.R. 

## 2014-12-01 NOTE — Anesthesia Postprocedure Evaluation (Signed)
Anesthesia Post Note  Patient: Renee Drake  Procedure(s) Performed: Procedure(s) (LRB): CESAREAN SECTION (N/A)  Anesthesia type: Epidural  Patient location: Mother/Baby  Post pain: Pain level controlled  Post assessment: Post-op Vital signs reviewed  Last Vitals:  Filed Vitals:   12/01/14 0613  BP: 113/84  Pulse: 68  Temp:   Resp: 20    Post vital signs: Reviewed  Level of consciousness: awake  Complications: No apparent anesthesia complications

## 2014-12-01 NOTE — Brief Op Note (Signed)
11/30/2014 - 12/01/2014  3:00 AM  PATIENT:  Renee Drake  39 y.o. 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, ROP, term  PROCEDURE:  Primary Cesarean section, kerr hysterotomy  SURGEON:  Surgeon(s) and Role:    * Maxie Better, MD - Primary  PHYSICIAN ASSISTANT:   ASSISTANTS: Donette Larry, CNM   ANESTHESIA:   epidural Findings: hyperextended ROP presentation, nl tubes and ovaries, live female Apgar 9/9 EBL:  Total I/O In: 2400 [I.V.:2400] Out: 1350 [Urine:250; Blood:1100]  BLOOD ADMINISTERED:none  DRAINS: none   LOCAL MEDICATIONS USED:  MARCAINE     SPECIMEN:  Source of Specimen:  placenta  DISPOSITION OF SPECIMEN:  PATHOLOGY  COUNTS:  YES  TOURNIQUET:  * No tourniquets in log *  DICTATION: .Other Dictation: Dictation Number [id number]  PLAN OF CARE: Admit to inpatient   PATIENT DISPOSITION:  PACU - hemodynamically stable.   Delay start of Pharmacological VTE agent (>24hrs) due to surgical blood loss or risk of bleeding: no

## 2014-12-01 NOTE — Anesthesia Postprocedure Evaluation (Signed)
  Anesthesia Post-op Note  Patient: Renee Drake  Procedure(s) Performed: Procedure(s): CESAREAN SECTION (N/A)  Patient Location: PACU  Anesthesia Type:Epidural  Level of Consciousness: awake, alert  and oriented  Airway and Oxygen Therapy: Patient Spontanous Breathing  Post-op Pain: none  Post-op Assessment: Post-op Vital signs reviewed, Patient's Cardiovascular Status Stable, Respiratory Function Stable, Patent Airway, No signs of Nausea or vomiting, Pain level controlled, No headache, No backache, Spinal receding and Patient able to bend at knees LLE Motor Response: Purposeful movement LLE Sensation: Tingling RLE Motor Response: Purposeful movement RLE Sensation: Tingling     Post-op Vital Signs: Reviewed and stable  Complications: No apparent anesthesia complications

## 2014-12-01 NOTE — Addendum Note (Signed)
Addendum  created 12/01/14 0826 by Jhonnie Garner, CRNA   Modules edited: Notes Section   Notes Section:  File: [id number]

## 2014-12-02 ENCOUNTER — Encounter (HOSPITAL_COMMUNITY): Payer: Self-pay | Admitting: Obstetrics and Gynecology

## 2014-12-02 DIAGNOSIS — D62 Acute posthemorrhagic anemia: Secondary | ICD-10-CM

## 2014-12-02 HISTORY — DX: Acute posthemorrhagic anemia: D62

## 2014-12-02 LAB — CBC
HCT: 23 % — ABNORMAL LOW (ref 36.0–46.0)
HEMOGLOBIN: 7.9 g/dL — AB (ref 12.0–15.0)
MCH: 33.3 pg (ref 26.0–34.0)
MCHC: 34.3 g/dL (ref 30.0–36.0)
MCV: 97 fL (ref 78.0–100.0)
Platelets: 183 10*3/uL (ref 150–400)
RBC: 2.37 MIL/uL — AB (ref 3.87–5.11)
RDW: 13.7 % (ref 11.5–15.5)
WBC: 14.2 10*3/uL — ABNORMAL HIGH (ref 4.0–10.5)

## 2014-12-02 MED ORDER — MAGNESIUM OXIDE 400 (241.3 MG) MG PO TABS
200.0000 mg | ORAL_TABLET | Freq: Two times a day (BID) | ORAL | Status: DC
  Administered 2014-12-02 – 2014-12-04 (×5): 200 mg via ORAL
  Filled 2014-12-02 (×5): qty 0.5

## 2014-12-02 MED ORDER — RHO D IMMUNE GLOBULIN 1500 UNIT/2ML IJ SOSY
300.0000 ug | PREFILLED_SYRINGE | Freq: Once | INTRAMUSCULAR | Status: AC
  Administered 2014-12-02: 300 ug via INTRAMUSCULAR
  Filled 2014-12-02: qty 2

## 2014-12-02 NOTE — Clinical Documentation Improvement (Signed)
Supporting Information: Labs:  H/H: 6/15:  7.9/23.0 6/13:  12.4/35.6  Treatment:  Ferrous sulfate 325 mg ordered.   Possible Clinical Condition: . Documentation of Anemia should include the type of anemia: --Nutritional --Hemolytic --Aplastic --Acute blood loss anemia --Acute on chronic blood loss anemia --Other (please specify) . Include in documentation if Anemia is due to nutrition or mineral deficits, resulting in a nutritional anemia . Document any "cause-and-effect" relationship between the intervention and the blood or immune disorder . Document the specific drug if anemia is drug-induced . Link any laboratory findings to a related diagnosis (if appropriate) . Document any associated diagnoses/conditions     Thank Gabriel Cirri Documentation Specialist [phone] [email or web address]

## 2014-12-02 NOTE — Lactation Note (Signed)
This note was copied from the chart of Renee Drake. Lactation Consultation Note  Pecola Leisure is now 45 hours old and just had first successful breastfeeding x 20 minutes using nipple shield.  Milk present in shield when baby came off.  Baby has been very sleepy.  Mom also initiated pumping with DEBP and obtained drops with first pumping.  Instructed to continue to feed with any feeding cue and post pump every 3 hours.  Instructed to call for assist/concerns prn.  Patient Name: Renee Drake Date: 12/02/2014     Maternal Data    Feeding Feeding Type: Breast Fed Length of feed: 0 min  LATCH Score/Interventions Latch: Too sleepy or reluctant, no latch achieved, no sucking elicited.  Audible Swallowing: None  Type of Nipple: Flat  Comfort (Breast/Nipple): Soft / non-tender     Hold (Positioning): Assistance needed to correctly position infant at breast and maintain latch.  LATCH Score: 4  Lactation Tools Discussed/Used Pump Review: Setup, frequency, and cleaning Initiated by:: pierina Date initiated:: 12/02/14   Consult Status      Renee Drake 12/02/2014, 1:22 PM

## 2014-12-02 NOTE — Progress Notes (Addendum)
Patient ID: Renee Drake, female   DOB: Mar 26, 1976, 39 y.o.   MRN: [id number] Subjective: S/P Primary Cesarean Delivery d/t Fetal Intolerance to Labor POD# 1 Information for the patient's newborn:  Rukaya, Booz [[id number]]  female   / circ done  Reports feeling well Feeding: breast - baby is too sleepy to latch Patient reports tolerating PO.  Breast symptoms: little to no colostrum with pumping / plan: pumping and/or nursing every 2-3 hrs (per LC) Pain controlled with ibuprofen (OTC) and narcotic analgesics including Percocet Denies HA/SOB/C/P/N/V/dizziness. Flatus absent. No BM. She reports vaginal bleeding as normal, without clots.  She is ambulating, urinating without difficult.     Objective:   VS:  Filed Vitals:   12/02/14 0100 12/02/14 0639 12/02/14 0703 12/02/14 1455  BP: 99/50 78/46 91/45  97/56  Pulse: 65 67 65 70  Temp: 97.6 F (36.4 C) 99 F (37.2 C)    TempSrc: Oral Oral    Resp: 18 16    Height:      Weight:      SpO2: 98%         Intake/Output Summary (Last 24 hours) at 12/02/14 1637 Last data filed at 12/02/14 0100  Gross per 24 hour  Intake    240 ml  Output    800 ml  Net   -560 ml         Recent Labs  11/30/14 1035 12/02/14 0510  WBC 14.7* 14.2*  HGB 12.4 7.9*  HCT 35.6* 23.0*  PLT 226 183     Blood type: B NEG (06/15 0510) / Rhophylac indicated - infant Rh POS  Rubella: Immune (03/28 0000)     Physical Exam:  General: alert, cooperative and no distress CV: Regular rate and rhythm, S1S2 present or without murmur or extra heart sounds Resp: clear Abdomen: soft, nontender, normal bowel sounds Incision: serous drainage present on Honeycomb / skin well- approximated with staples Uterine Fundus: firm, U-even, nontender Lochia: minimal Ext: extremities normal, atraumatic, no cyanosis or edema, Homans sign is negative, no sign of DVT and no edema, redness or tenderness in the calves or thighs  Assessment/Plan: 39 y.o.   POD# 1.   s/p Cesarean Delivery.  Indications: fetal intolerance to labor                Principal Problem:   Postpartum care following cesarean delivery (6/14) Active Problems:   Pregnancy   Postoperative anemia due to acute blood loss  Doing well, stable.               Regular diet as tolerated D/C saline Lock per unit protocol Continue FeSO4 325 mg BID Start Magnesium Oxide 200 mg BID Ambulate Routine post-op care  Kenard Gower, MSN, CNM 12/02/2014, 4:37 PM

## 2014-12-03 LAB — CBC
HEMATOCRIT: 24.6 % — AB (ref 36.0–46.0)
Hemoglobin: 8.4 g/dL — ABNORMAL LOW (ref 12.0–15.0)
MCH: 33.5 pg (ref 26.0–34.0)
MCHC: 34.1 g/dL (ref 30.0–36.0)
MCV: 98 fL (ref 78.0–100.0)
Platelets: 227 10*3/uL (ref 150–400)
RBC: 2.51 MIL/uL — ABNORMAL LOW (ref 3.87–5.11)
RDW: 13.8 % (ref 11.5–15.5)
WBC: 11.4 10*3/uL — AB (ref 4.0–10.5)

## 2014-12-03 LAB — RH IG WORKUP (INCLUDES ABO/RH)
ABO/RH(D): B NEG
Fetal Screen: NEGATIVE
Gestational Age(Wks): 39
UNIT DIVISION: 0

## 2014-12-03 NOTE — Progress Notes (Addendum)
Patient ID: Renee Drake, female   DOB: 05/17/1976, 39 y.o.   MRN: [id number] Subjective: S/P Primary Cesarean Delivery d/t Fetal Intolerance to Labor POD# 2 Information for the patient's newborn:  Waldene, Hegger [[id number]]  female   / circ done  Reports feeling well Feeding: breast - nursing a little better Patient reports tolerating PO.  Breast symptoms: pumping and/or nursing every 2-3 hrs Pain controlled with ibuprofen (OTC) and narcotic analgesics including Percocet Denies HA/SOB/C/P/N/V/dizziness. Flatus absent. No BM. She reports vaginal bleeding as normal, without clots.  She is ambulating, urinating without difficult.     Objective:   VS:  Filed Vitals:   12/02/14 0703 12/02/14 1455 12/02/14 1723 12/03/14 0700  BP: 91/45 97/56 95/51  98/59  Pulse: 65 70 73 64  Temp:   98.5 F (36.9 C) 98.3 F (36.8 C)  TempSrc:   Oral Oral  Resp:   18 17  Height:      Weight:      SpO2:            Recent Labs  11/30/14 1035 12/02/14 0510  WBC 14.7* 14.2*  HGB 12.4 7.9*  HCT 35.6* 23.0*  PLT 226 183     Blood type: B NEG (06/15 0510) / Rhophylac indicated - infant Rh POS  Rubella: Immune (03/28 0000)     Physical Exam:  General: alert, cooperative and no distress Abdomen: soft, nontender, normal bowel sounds Incision: serous drainage present on Honeycomb / skin well- approximated with staples Uterine Fundus: firm, U-1, nontender Lochia: minimal Ext: extremities normal, atraumatic, no cyanosis or edema, Homans sign is negative, no sign of DVT and no edema, redness or tenderness in the calves or thighs  Assessment/Plan: 39 y.o.   POD# 2.  s/p Cesarean Delivery.  Indications: fetal intolerance to labor                Principal Problem:   Postpartum care following cesarean delivery (6/14) Active Problems:   Pregnancy   Postoperative anemia due to acute blood loss  Doing well, stable.               Regular diet as tolerated Continue FeSO4 325 mg  BID Continue Magnesium Oxide 200 mg BID Ambulate Routine post-op care Repeat CBC - STAT Anticipate D/C home tomorrow  Raelyn Mora, M, MSN, CNM 12/03/2014, 7:57 AM

## 2014-12-04 DIAGNOSIS — Z6791 Unspecified blood type, Rh negative: Secondary | ICD-10-CM

## 2014-12-04 DIAGNOSIS — O26899 Other specified pregnancy related conditions, unspecified trimester: Secondary | ICD-10-CM | POA: Diagnosis present

## 2014-12-04 MED ORDER — FERROUS SULFATE 325 (65 FE) MG PO TABS: 325.0000 mg | ORAL_TABLET | Freq: Two times a day (BID) | ORAL | Status: DC

## 2014-12-04 MED ORDER — OXYCODONE-ACETAMINOPHEN 5-325 MG PO TABS: 1.0000 | ORAL_TABLET | ORAL | Status: DC | PRN

## 2014-12-04 MED ORDER — IBUPROFEN 600 MG PO TABS: 600.0000 mg | ORAL_TABLET | Freq: Four times a day (QID) | ORAL | Status: DC | PRN

## 2014-12-04 MED ORDER — MAGNESIUM OXIDE 400 (241.3 MG) MG PO TABS: 200.0000 mg | ORAL_TABLET | Freq: Two times a day (BID) | ORAL | Status: DC

## 2014-12-04 NOTE — Lactation Note (Signed)
This note was copied from the chart of Renee Drake. Lactation Consultation Note Baby had 9% weight loss. BF well has had 11 stools, and 6 voids at 66 hrs. Old. I feel this may contribute to some of the weight loss. Patient Name: Renee Drake Date: 12/04/2014     Maternal Data    Feeding Feeding Type: Breast Fed Length of feed: 30 min  LATCH Score/Interventions                      Lactation Tools Discussed/Used     Consult Status      Renee Drake 12/04/2014, 7:49 AM

## 2014-12-04 NOTE — Discharge Summary (Signed)
DISCHARGE SUMMARY:  Patient ID: Renee Drake MRN: [id number] DOB/AGE: 07-19-1975 39 y.o.  Admit date: 11/30/2014 Admission Diagnoses: [redacted] weeks gestation, active labor   Discharge date: 12/04/2014 Discharge Diagnoses: S/P C/S on 12/01/14, ABL anemia        Prenatal history: G1P1001   EDC: 12/05/2014, by Other Basis  Prenatal care at [facility] since [redacted] wks gestation. Primary provider: Dr. Cherly Hensen Prenatal course complicated by Rh negative, AMA  Prenatal labs: ABO, Rh: --/--/B NEG (06/15 0510) / Rhophylac 09/14/14 Antibody: NEG (06/13 1035) Rubella:   Immune RPR: Non Reactive (06/13 1035)  HBsAg: Negative (03/28 0000)  HIV: Non-reactive, Non-reactive, Non-reactive (03/28 0000)  GBS: Negative (06/09 0000)  GTT: 117  Medical / Surgical History :  Past medical history:  Past Medical History  Diagnosis Date  . Medical history non-contributory   . HPV (human papilloma virus) anogenital infection   . Postoperative anemia due to acute blood loss 12/02/2014    Past surgical history:  Past Surgical History  Procedure Laterality Date  . No past surgeries    . Cesarean section N/A 12/01/2014    Procedure: CESAREAN SECTION;  Surgeon: Maxie Better, MD;  Location: [facility];  Service: Obstetrics;  Laterality: N/A;     Medications on Admission: No prescriptions prior to admission    Allergies: Review of patient's allergies indicates no known allergies.   Intrapartum Course:  Admitted for labor, epidural, Pitocin augmentation, AROM, repetitive variable decels, amnioinfusion, repetitive late decels, urgent CS.  Postpartum Course: Complicated by ABL anemia, Rh negative-Rhogam administered  Physical Exam:   VSS: Blood pressure 100/52, pulse 72, temperature 98 F (36.7 C), temperature source Oral, resp. rate 16, height  (1.6 m), weight 81.194 kg (179 lb), SpO2 98 %, unknown if currently breastfeeding.  LABS:  Recent Labs  12/02/14 0510  12/03/14 0845  WBC 14.2* 11.4*  HGB 7.9* 8.4*  PLT 183 227    General: alert and oriented x3 Heart: RRR Lungs: CTA bilaterally GI: soft, non-tender, non-distended, BS x4 Lochia: small Uterus: firm below umbilicus Incision: well approximated with staples; honeycomb dressing-no significant erythema, drainage, or edema Extremities: No edema, Homans neg   Newborn Data Live born female  Birth Weight: 7 lb 11.1 oz (3490 g) APGAR: 9, 9  See operative report for further details  Home with mother.  Discharge Instructions:  Wound Care: keep clean and dry / remove honeycomb POD 6 Postpartum Instructions: [facility] discharge booklet - instructions reviewed Medications:    Medication List    STOP taking these medications        calcium carbonate 500 MG chewable tablet  Commonly known as:  TUMS - dosed in mg elemental calcium      TAKE these medications        ferrous sulfate 325 (65 FE) MG tablet  Take 1 tablet (325 mg total) by mouth 2 (two) times daily with a meal.     ibuprofen 600 MG tablet  Commonly known as:  ADVIL,MOTRIN  Take 1 tablet (600 mg total) by mouth every 6 (six) hours as needed for mild pain.     magnesium oxide 400 (241.3 MG) MG tablet  Commonly known as:  MAG-OX  Take 0.5 tablets (200 mg total) by mouth 2 (two) times daily.     oxyCODONE-acetaminophen 5-325 MG per tablet  Commonly known as:  PERCOCET/ROXICET  Take 1-2 tablets by mouth every 4 (four) hours as needed (for pain scale greater than 7).  prenatal multivitamin Tabs tablet  Take 1 tablet by mouth daily at 12 noon.            Follow-up Information    Follow up with COUSINS,SHERONETTE A, MD. Schedule an appointment as soon as possible for a visit in 4 days.   Specialty:  Obstetrics and Gynecology   Contact information:   [address] [place] [address] [phone]       Follow up with COUSINS,SHERONETTE A, MD. Schedule an appointment as soon as possible for a visit in 6  weeks.   Specialty:  Obstetrics and Gynecology   Contact information:   [address] [place] [address] [phone]         Signed: Donette Larry, Dorris Carnes MSN, CNM 12/04/2014, 3:57 PM

## 2014-12-04 NOTE — Lactation Note (Addendum)
This note was copied from the chart of Boy Devlynn Pearl. Lactation Consultation Note  Patient Name: Boy Calleigh Ghassemi OEVOJ'J Date: 12/04/2014 Reason for consult: Follow-up assessment;Infant weight loss;Other (Comment) (8% weight loss )  80 hours old - Bili check at 70 hrs. = 13.1 , BF range 15 -30 mins with #20 NS , and supplementing 4-18 ml x5 , 7 voids, 11 stools. Latch score - 8's  Baby is 11 hours old and per mom milk is in and breast are feeling fuller , denies engorgement or nipple soreness. Per mom #20 NS fitting well and comfortable with latch . LC reviewed sore  Nipple and engorgement prevention and tx. Referring to the Baby and me booklet pages 24. Mom and dad receptive to F/U with LC O/P Monday 6/20 at 1030 , apt reminder given to mom. Per mom has a DEBP at home. Mother informed of post-discharge support and given phone number to the lactation department, including services  for phone call assistance; out-patient appointments; and breastfeeding support group. List of other breastfeeding  resources in the community given in the handout. Encouraged mother to call for problems or concerns related to breastfeeding. Both mom nan dad receptive to review and teaching.    Maternal Data    Feeding Feeding Type: Breast Fed Length of feed: 15 min  LATCH Score/Interventions                Intervention(s): Breastfeeding basics reviewed     Lactation Tools Discussed/Used     Consult Status Consult Status: Follow-up Date: 12/07/14 (@ 1030 am , Apt reminder given to mom ) Follow-up type: Out-patient    Kathrin Greathouse 12/04/2014, 11:14 AM

## 2014-12-04 NOTE — Progress Notes (Signed)
POD # 3  Subjective: Pt reports feeling well, ready for discharge/ Pain controlled with Motrin and Percocet Tolerating po/Voiding without problems/ No n/v/ Flatus present Activity: ad lib Bleeding is light Newborn info:  Information for the patient's newborn:  Cadesha, Randlett [[id number]]  female  / Circumcision: done/ Feeding: breast   Objective: VS: VS:  Filed Vitals:   12/02/14 1723 12/03/14 0700 12/03/14 1658 12/04/14 0613  BP: 95/51 98/59 113/61 100/52  Pulse: 73 64 81 72  Temp: 98.5 F (36.9 C) 98.3 F (36.8 C) 98.4 F (36.9 C) 98 F (36.7 C)  TempSrc: Oral Oral Oral Oral  Resp: 18 17 18 16   Height:      Weight:      SpO2:        I&O: Intake/Output    None     LABS:  Recent Labs  12/02/14 0510 12/03/14 0845  WBC 14.2* 11.4*  HGB 7.9* 8.4*  PLT 183 227   Blood type: --/--/B NEG (06/15 0510) Rubella: Immune (03/28 0000)           Physical Exam:  General: alert and cooperative CV: Regular rate and rhythm Resp: CTA bilaterally Abdomen: soft, nontender, normal bowel sounds Incision: Covered with Tegaderm and honeycomb dressing; no significant drainage, edema, bruising, or erythema; well approximated with staples Uterine Fundus: firm, below umbilicus, nontender Lochia: minimal Ext: extremities normal, atraumatic, no cyanosis or edema and Homans sign is negative, no sign of DVT    Assessment: POD # 3/ G1P1001/ S/P C/Section d/t fetal intolerance of labor  ABL anemia Rh negative-Rhogam given Doing well and stable for discharge home  Plan: Discharge home RX's: Ibuprofen 600mg  po Q 6 hrs prn pain #30 Refill x 1 Percocet 5/325 1 - 2 tabs po every 4 hrs prn pain #30 Refill x 0 Niferex 150mg  po BID #60 Refill x 1 Mag Oxide 200 mg po daily #30, refill x1 Follow up in 6 wks for postpartum check at [facility] Follow-up in 4 days for staple removal at [facility] booklet given    Signed: Donette Larry, Dorris Carnes, MSN, CNM 12/04/2014, 9:23 AM

## 2014-12-04 NOTE — Lactation Note (Signed)
This note was copied from the chart of Renee Drake. Lactation Consultation Note  Patient Name: Renee Gurtrude Essick [id number] Date: 12/04/2014 Reason for consult: Follow-up assessment   Follow up visit at 68 hours old with a 7% weight loss but infant has had lots voids and stools.   Infant has breastfed x9 (15-30 min) + attempts x4 (0 min) + EBM x1 (4 ml) in past 24 hours; voids-3 in 24 hours/ 6 life; stools-8 in 24 hours/ 10 life. When [facility] entered room mom stated infant had just fed, and stated she was about to give him EBM supplement she had pump.   Mom had EBM - 5-6 ml in curved-tip syringe. Mom stated infant is feeding well and did not have any concerns. Lactation will continue to follow infant's progress.     Consult Status Consult Status: Follow-up Date: 12/04/14 Follow-up type: In-patient    Lendon Ka 12/04/2014, 12:11 AM

## 2014-12-07 ENCOUNTER — Ambulatory Visit (HOSPITAL_COMMUNITY): Payer: BLUE CROSS/BLUE SHIELD

## 2018-06-27 ENCOUNTER — Encounter: Payer: Self-pay | Admitting: Nurse Practitioner

## 2018-06-27 ENCOUNTER — Ambulatory Visit (INDEPENDENT_AMBULATORY_CARE_PROVIDER_SITE_OTHER): Payer: 59 | Admitting: Nurse Practitioner

## 2018-06-27 VITALS — BP 124/90 | HR 69 | Temp 98.8°F | Ht 63.0 in | Wt 161.8 lb

## 2018-06-27 DIAGNOSIS — F4321 Adjustment disorder with depressed mood: Secondary | ICD-10-CM

## 2018-06-27 DIAGNOSIS — Z136 Encounter for screening for cardiovascular disorders: Secondary | ICD-10-CM | POA: Diagnosis not present

## 2018-06-27 DIAGNOSIS — R1905 Periumbilic swelling, mass or lump: Secondary | ICD-10-CM

## 2018-06-27 DIAGNOSIS — Z1231 Encounter for screening mammogram for malignant neoplasm of breast: Secondary | ICD-10-CM | POA: Diagnosis not present

## 2018-06-27 DIAGNOSIS — Z0001 Encounter for general adult medical examination with abnormal findings: Secondary | ICD-10-CM

## 2018-06-27 DIAGNOSIS — Z13220 Encounter for screening for lipoid disorders: Secondary | ICD-10-CM | POA: Diagnosis not present

## 2018-06-27 DIAGNOSIS — F4320 Adjustment disorder, unspecified: Secondary | ICD-10-CM

## 2018-06-27 NOTE — Patient Instructions (Addendum)
You may use tylenol PM or Melatonin (3-48m) at bedtime.  Return to lab fasting for blood draw (fasting 6-8hrs prior to blood draw, ok to drink water).  Normal cbc, TSH, renal and liver function. Mild elevation in glucose and LDL. These number can improve with heart healthy diet and regular exercise. F/up in 1year for CPE (fasting, for repeat labs).  Health Maintenance, Female Adopting a healthy lifestyle and getting preventive care can go a long way to promote health and wellness. Talk with your health care provider about what schedule of regular examinations is right for you. This is a good chance for you to check in with your provider about disease prevention and staying healthy. In between checkups, there are plenty of things you can do on your own. Experts have done a lot of research about which lifestyle changes and preventive measures are most likely to keep you healthy. Ask your health care provider for more information. Weight and diet Eat a healthy diet  Be sure to include plenty of vegetables, fruits, low-fat dairy products, and lean protein.  Do not eat a lot of foods high in solid fats, added sugars, or salt.  Get regular exercise. This is one of the most important things you can do for your health. ? Most adults should exercise for at least 150 minutes each week. The exercise should increase your heart rate and make you sweat (moderate-intensity exercise). ? Most adults should also do strengthening exercises at least twice a week. This is in addition to the moderate-intensity exercise. Maintain a healthy weight  Body mass index (BMI) is a measurement that can be used to identify possible weight problems. It estimates body fat based on height and weight. Your health care provider can help determine your BMI and help you achieve or maintain a healthy weight.  For females 282years of age and older: ? A BMI below 18.5 is considered underweight. ? A BMI of 18.5 to 24.9 is  normal. ? A BMI of 25 to 29.9 is considered overweight. ? A BMI of 30 and above is considered obese. Watch levels of cholesterol and blood lipids  You should start having your blood tested for lipids and cholesterol at 41years of age, then have this test every 5 years.  You may need to have your cholesterol levels checked more often if: ? Your lipid or cholesterol levels are high. ? You are older than 41years of age. ? You are at high risk for heart disease. Cancer screening Lung Cancer  Lung cancer screening is recommended for adults [id number] old who are at high risk for lung cancer because of a history of smoking.  A yearly low-dose CT scan of the lungs is recommended for people who: ? Currently smoke. ? Have quit within the past 15 years. ? Have at least a 30-pack-year history of smoking. A pack year is smoking an average of one pack of cigarettes a day for 1 year.  Yearly screening should continue until it has been 15 years since you quit.  Yearly screening should stop if you develop a health problem that would prevent you from having lung cancer treatment. Breast Cancer  Practice breast self-awareness. This means understanding how your breasts normally appear and feel.  It also means doing regular breast self-exams. Let your health care provider know about any changes, no matter how small.  If you are in your 20s or 30s, you should have a clinical breast exam (CBE) by a health  care provider every 1-3 years as part of a regular health exam.  If you are 61 or older, have a CBE every year. Also consider having a breast X-ray (mammogram) every year.  If you have a family history of breast cancer, talk to your health care provider about genetic screening.  If you are at high risk for breast cancer, talk to your health care provider about having an MRI and a mammogram every year.  Breast cancer gene (BRCA) assessment is recommended for women who have family members with  BRCA-related cancers. BRCA-related cancers include: ? Breast. ? Ovarian. ? Tubal. ? Peritoneal cancers.  Results of the assessment will determine the need for genetic counseling and BRCA1 and BRCA2 testing. Cervical Cancer Your health care provider may recommend that you be screened regularly for cancer of the pelvic organs (ovaries, uterus, and vagina). This screening involves a pelvic examination, including checking for microscopic changes to the surface of your cervix (Pap test). You may be encouraged to have this screening done every 3 years, beginning at age 56.  For women ages 56-65, health care providers may recommend pelvic exams and Pap testing every 3 years, or they may recommend the Pap and pelvic exam, combined with testing for human papilloma virus (HPV), every 5 years. Some types of HPV increase your risk of cervical cancer. Testing for HPV may also be done on women of any age with unclear Pap test results.  Other health care providers may not recommend any screening for nonpregnant women who are considered low risk for pelvic cancer and who do not have symptoms. Ask your health care provider if a screening pelvic exam is right for you.  If you have had past treatment for cervical cancer or a condition that could lead to cancer, you need Pap tests and screening for cancer for at least 20 years after your treatment. If Pap tests have been discontinued, your risk factors (such as having a new sexual partner) need to be reassessed to determine if screening should resume. Some women have medical problems that increase the chance of getting cervical cancer. In these cases, your health care provider may recommend more frequent screening and Pap tests. Colorectal Cancer  This type of cancer can be detected and often prevented.  Routine colorectal cancer screening usually begins at 41 years of age and continues through 41 years of age.  Your health care provider may recommend screening at  an earlier age if you have risk factors for colon cancer.  Your health care provider may also recommend using home test kits to check for hidden blood in the stool.  A small camera at the end of a tube can be used to examine your colon directly (sigmoidoscopy or colonoscopy). This is done to check for the earliest forms of colorectal cancer.  Routine screening usually begins at age 72.  Direct examination of the colon should be repeated every 5-10 years through 41 years of age. However, you may need to be screened more often if early forms of precancerous polyps or small growths are found. Skin Cancer  Check your skin from head to toe regularly.  Tell your health care provider about any new moles or changes in moles, especially if there is a change in a mole's shape or color.  Also tell your health care provider if you have a mole that is larger than the size of a pencil eraser.  Always use sunscreen. Apply sunscreen liberally and repeatedly throughout the day.  Protect  yourself by wearing long sleeves, pants, a wide-brimmed hat, and sunglasses whenever you are outside. Heart disease, diabetes, and high blood pressure  High blood pressure causes heart disease and increases the risk of stroke. High blood pressure is more likely to develop in: ? People who have blood pressure in the high end of the normal range (130-139/85-89 mm Hg). ? People who are overweight or obese. ? People who are African American.  If you are 14-4 years of age, have your blood pressure checked every 3-5 years. If you are 70 years of age or older, have your blood pressure checked every year. You should have your blood pressure measured twice-once when you are at a hospital or clinic, and once when you are not at a hospital or clinic. Record the average of the two measurements. To check your blood pressure when you are not at a hospital or clinic, you can use: ? An automated blood pressure machine at a pharmacy. ? A  home blood pressure monitor.  If you are between 34 years and 54 years old, ask your health care provider if you should take aspirin to prevent strokes.  Have regular diabetes screenings. This involves taking a blood sample to check your fasting blood sugar level. ? If you are at a normal weight and have a low risk for diabetes, have this test once every three years after 41 years of age. ? If you are overweight and have a high risk for diabetes, consider being tested at a younger age or more often. Preventing infection Hepatitis B  If you have a higher risk for hepatitis B, you should be screened for this virus. You are considered at high risk for hepatitis B if: ? You were born in a country where hepatitis B is common. Ask your health care provider which countries are considered high risk. ? Your parents were born in a high-risk country, and you have not been immunized against hepatitis B (hepatitis B vaccine). ? You have HIV or AIDS. ? You use needles to inject street drugs. ? You live with someone who has hepatitis B. ? You have had sex with someone who has hepatitis B. ? You get hemodialysis treatment. ? You take certain medicines for conditions, including cancer, organ transplantation, and autoimmune conditions. Hepatitis C  Blood testing is recommended for: ? Everyone born from [birth year] through 1963. ? Anyone with known risk factors for hepatitis C. Sexually transmitted infections (STIs)  You should be screened for sexually transmitted infections (STIs) including gonorrhea and chlamydia if: ? You are sexually active and are younger than 41 years of age. ? You are older than 41 years of age and your health care provider tells you that you are at risk for this type of infection. ? Your sexual activity has changed since you were last screened and you are at an increased risk for chlamydia or gonorrhea. Ask your health care provider if you are at risk.  If you do not have HIV, but are  at risk, it may be recommended that you take a prescription medicine daily to prevent HIV infection. This is called pre-exposure prophylaxis (PrEP). You are considered at risk if: ? You are sexually active and do not regularly use condoms or know the HIV status of your partner(s). ? You take drugs by injection. ? You are sexually active with a partner who has HIV. Talk with your health care provider about whether you are at high risk of being infected with HIV. If  you choose to begin PrEP, you should first be tested for HIV. You should then be tested every 3 months for as long as you are taking PrEP. Pregnancy  If you are premenopausal and you may become pregnant, ask your health care provider about preconception counseling.  If you may become pregnant, take 400 to 800 micrograms (mcg) of folic acid every day.  If you want to prevent pregnancy, talk to your health care provider about birth control (contraception). Osteoporosis and menopause  Osteoporosis is a disease in which the bones lose minerals and strength with aging. This can result in serious bone fractures. Your risk for osteoporosis can be identified using a bone density scan.  If you are 11 years of age or older, or if you are at risk for osteoporosis and fractures, ask your health care provider if you should be screened.  Ask your health care provider whether you should take a calcium or vitamin D supplement to lower your risk for osteoporosis.  Menopause may have certain physical symptoms and risks.  Hormone replacement therapy may reduce some of these symptoms and risks. Talk to your health care provider about whether hormone replacement therapy is right for you. Follow these instructions at home:  Schedule regular health, dental, and eye exams.  Stay current with your immunizations.  Do not use any tobacco products including cigarettes, chewing tobacco, or electronic cigarettes.  If you are pregnant, do not drink  alcohol.  If you are breastfeeding, limit how much and how often you drink alcohol.  Limit alcohol intake to no more than 1 drink per day for nonpregnant women. One drink equals 12 ounces of beer, 5 ounces of wine, or 1 ounces of hard liquor.  Do not use street drugs.  Do not share needles.  Ask your health care provider for help if you need support or information about quitting drugs.  Tell your health care provider if you often feel depressed.  Tell your health care provider if you have ever been abused or do not feel safe at home. This information is not intended to replace advice given to you by your health care provider. Make sure you discuss any questions you have with your health care provider. Document Released: 12/19/2010 Document Revised: 11/11/2015 Document Reviewed: 03/09/2015 Elsevier Interactive Patient Education  2019 [employer] With Loss, Adult People experience loss in many different ways throughout their lives. Events such as moving, changing jobs, and losing friends can create a sense of loss. The loss may be as serious as a major health change, divorce, death of a pet, or death of a loved one. All of these types of loss are likely to create a physical and emotional reaction known as grief. Grief is the result of a major change or an absence of something or someone that you count on. Grief is a normal reaction to loss. How to recognize changes A variety of factors can affect your grieving experience, including:  The nature of your loss.  Your relationship to what or whom you lost.  Your understanding of grief and how to cope with it.  Your support system. The way that you deal with your grief will affect your ability to function as you normally do. When you are grieving, you may experience:  Numbness, shock, sadness, anxiety, anger, denial, and guilt.  Thoughts about death.  Unexpected crying.  A physical sensation of emptiness in your  gut.  Problems sleeping and eating.  Fatigue.  Loss  of interest in normal activities.  Dreaming about or imagining seeing the person who died.  A need to remember what or whom you lost.  Difficulty thinking about anything other than your loss for a period of time.  Relief. If you have been expecting the loss for a while, you may feel a sense of relief when it happens. Where to find support To get support for coping with loss:  Ask your health care provider for help and recommendations, such as grief counseling or therapy.  Think about joining a support group for people who are coping with loss. Follow these instructions at home:   Be patient with yourself and others. Allow the grieving process to happen, and remember that grieving takes time. ? It is likely that you may never feel completely done with some grief. You may find a way to move on while still cherishing memories and feelings about your loss. ? Accepting your loss is a process. It can take months or longer to adjust.  Express your feelings in healthy ways, such as: ? Talking with others about your loss. It may be helpful to find others who have had a similar loss, such as a support group. ? Writing down your feelings in a journal. ? Doing physical activities to release stress and emotional energy. ? Doing creative activities like painting, sculpting, or playing or listening to music. ? Practicing resilience. This is the ability to recover and adjust after facing challenges. Reading some resources that encourage resilience may help you to learn ways to practice those behaviors.  Keep to your normal routine as much as possible. If you have trouble focusing or doing normal activities, it is acceptable to take some time away from your normal routine.  Spend time with friends and loved ones.  Eat a healthy diet, get plenty of sleep, and rest when you feel tired. Where to find more information You can find more  information about coping with loss from:  American Society of Clinical Oncology: www.cancer.net  American Psychological Association: TVStere[employer].ch Contact a health care provider if:  Your grief is extreme and keeps getting worse.  You have ongoing grief that does not improve.  Your body shows symptoms of grief, such as illness.  You feel depressed, anxious, or lonely. Get help right away if:  You have thoughts about hurting yourself or others. If you ever feel like you may hurt yourself or others, or have thoughts about taking your own life, get help right away. You can go to your nearest emergency department or call:  Your local emergency services (911 in the [place]  A suicide crisis helpline, such as the [place] at (601) 159-1741. This is open 24 hours a day. Summary  Grief is a normal part of experiencing a loss. It is the result of a major change or an absence of something or someone that you count on.  The depth of grief and the period of recovery depend on the type of loss as well as your ability to adjust to the change and process your feelings.  Processing grief requires patience and a willingness to accept your feelings and talk about your loss with people who are supportive.  It is important to find resources that work for you and to realize that we are all different when it comes to grief. There is not one single grieving process that works for everyone in the same way.  Be aware that when grief becomes extreme, it can lead  to more severe issues like isolation, depression, anxiety, or suicidal thoughts. Talk with your health care provider if you have any of these issues. This information is not intended to replace advice given to you by your health care provider. Make sure you discuss any questions you have with your health care provider. Document Released: 10/19/2016 Document Revised: 10/19/2016 Document Reviewed: 10/19/2016 Elsevier  Interactive Patient Education  2019 [employer].

## 2018-06-27 NOTE — Progress Notes (Signed)
Subjective:    Patient ID: Renee Drake, female    DOB: 1975/07/01, 43 y.o.   MRN: [id number]  Patient presents today for complete physical   HPI  ABD mass noted 66month ago, no change in size, no change in BM, no nausea, no pain, swelling.  Loss of close friend last week. Unexpected, develop[ed insomnia, headache, dizziness and daytime fatigue.  Sexual History (orientation,birth control, marital status, STD):Dr. Cherly Hensen, last PAP 2018 (normal), IUD removed 01/2017, trying to get pregnant.   Depression/Suicide: Depression screen PHQ 2/9 06/27/2018  Decreased Interest 0  Down, Depressed, Hopeless 0  PHQ - 2 Score 0   Vision:done annually  Dental:done every 46month  Immunizations: (TDAP, Hep C screen, Pneumovax, Influenza, zoster)  Health Maintenance  Topic Date Due  . Pap Smear  06/13/1999  . Flu Shot  09/17/2018*  . Tetanus Vaccine  06/19/2024  . HIV Screening  Completed  *Topic was postponed. The date shown is not the original due date.   Diet:regular.  Weight:  Wt Readings from Last 3 Encounters:  06/27/18 161 lb 12.8 oz (73.4 kg)  11/30/14 179 lb (81.2 kg)  11/30/14 179 lb (81.2 kg)   Exercise:cardio once a week  Fall Risk: Fall Risk  06/27/2018  Falls in the past year? 0   Advanced Directive: Advanced Directives 12/01/2014  Does Patient Have a Medical Advance Directive? No  Would patient like information on creating a medical advance directive? -     Medications and allergies reviewed with patient and updated if appropriate.  Patient Active Problem List   Diagnosis Date Noted  . Grief reaction 06/27/2018  . Periumbilical mass 06/27/2018  . Rh negative status during pregnancy 12/04/2014  . Postoperative anemia due to acute blood loss 12/02/2014  . Postpartum care following cesarean delivery (6/14) 12/01/2014  . Pregnancy 11/30/2014    Current Outpatient Medications on File Prior to Visit  Medication Sig Dispense Refill  . Prenatal Vit-Fe  Fumarate-FA (PRENATAL MULTIVITAMIN) TABS tablet Take 1 tablet by mouth daily at 12 noon.     No current facility-administered medications on file prior to visit.     Past Medical History:  Diagnosis Date  . Chicken pox   . HPV (human papilloma virus) anogenital infection   . Medical history non-contributory   . Postoperative anemia due to acute blood loss 12/02/2014    Past Surgical History:  Procedure Laterality Date  . CESAREAN SECTION N/A 12/01/2014   Procedure: CESAREAN SECTION;  Surgeon: Maxie Better, MD;  Location: [facility];  Service: Obstetrics;  Laterality: N/A;  . NO PAST SURGERIES      Social History   Socioeconomic History  . Marital status: Single    Spouse name: Not on file  . Number of children: 1  . Years of education: Not on file  . Highest education level: Not on file  Occupational History  . Not on file  Social Needs  . Financial resource strain: Not on file  . Food insecurity:    Worry: Not on file    Inability: Not on file  . Transportation needs:    Medical: Not on file    Non-medical: Not on file  Tobacco Use  . Smoking status: Never Smoker  . Smokeless tobacco: Never Used  Substance and Sexual Activity  . Alcohol use: Yes    Comment: social  . Drug use: No  . Sexual activity: Yes    Birth control/protection: None  Lifestyle  . Physical activity:  Days per week: Not on file    Minutes per session: Not on file  . Stress: Not on file  Relationships  . Social connections:    Talks on phone: Not on file    Gets together: Not on file    Attends religious service: Not on file    Active member of club or organization: Not on file    Attends meetings of clubs or organizations: Not on file    Relationship status: Not on file  Other Topics Concern  . Not on file  Social History Narrative  . Not on file    Family History  Problem Relation Age of Onset  . Diabetes Mother   . Cancer Father        esophagel cancer  . Cancer Maternal  Grandmother        breast cancer  . Diabetes Maternal Grandmother   . Diabetes Maternal Grandfather   . Heart disease Maternal Grandfather   . Cancer Paternal Grandmother        throat cancer  . Heart attack Paternal Grandfather         Review of Systems  Constitutional: Negative for fever, malaise/fatigue and weight loss.  HENT: Negative for congestion and sore throat.   Eyes:       Negative for visual changes  Respiratory: Negative for cough and shortness of breath.   Cardiovascular: Negative for chest pain, palpitations and leg swelling.  Gastrointestinal: Negative for blood in stool, constipation, diarrhea and heartburn.  Genitourinary: Negative for dysuria, frequency and urgency.  Musculoskeletal: Negative for falls, joint pain and myalgias.  Skin: Negative for rash.  Neurological: Negative for dizziness, sensory change and headaches.  Endo/Heme/Allergies: Does not bruise/bleed easily.  Psychiatric/Behavioral: Negative for depression, substance abuse and suicidal ideas. The patient is not nervous/anxious.     Objective:   Vitals:   06/27/18 1433  BP: 124/90  Pulse: 69  Temp: 98.8 F (37.1 C)  SpO2: 99%    Body mass index is 28.66 kg/m.   Physical Examination:  Physical Exam Vitals signs reviewed.  Constitutional:      Appearance: She is obese.  HENT:     Right Ear: Tympanic membrane, ear canal and external ear normal.     Left Ear: Tympanic membrane, ear canal and external ear normal.     Nose: Nose normal.     Mouth/Throat:     Mouth: Mucous membranes are moist.     Pharynx: Oropharynx is clear.  Eyes:     Extraocular Movements: Extraocular movements intact.     Conjunctiva/sclera: Conjunctivae normal.     Pupils: Pupils are equal, round, and reactive to light.  Neck:     Musculoskeletal: Normal range of motion and neck supple.  Cardiovascular:     Rate and Rhythm: Normal rate and regular rhythm.     Pulses: Normal pulses.     Heart sounds:  Normal heart sounds.  Pulmonary:     Effort: Pulmonary effort is normal.     Breath sounds: Normal breath sounds.  Chest:     Comments: mammogram schedule for this afternoon. Abdominal:     General: Abdomen is flat. Bowel sounds are normal.     Palpations: Abdomen is soft. There is mass.     Tenderness: There is no abdominal tenderness. There is no guarding.    Genitourinary:    Comments: Declined,  Musculoskeletal:     Right lower leg: No edema.     Left lower leg: No  edema.  Neurological:     Mental Status: She is alert and oriented to person, place, and time.  Psychiatric:        Mood and Affect: Mood normal.        Behavior: Behavior normal.        Thought Content: Thought content normal.     ASSESSMENT and PLAN:  Sharlene DoryLekeshia was seen today for establish care.  Diagnoses and all orders for this visit:  Encounter for preventative adult health care exam with abnormal findings -     CBC; Future -     Comprehensive metabolic panel; Future -     TSH; Future -     Lipid panel; Future  Encounter for lipid screening for cardiovascular disease -     Lipid panel; Future  Periumbilical mass -     US Abdomen Limited; Future  Grief reaction   No problem-specific Assessment & Plan notes found for this encounter.     Problem List Items Addressed This Visit      Other   Grief reaction   Periumbilical mass   Relevant Orders   US Abdomen Limited    Other Visit Diagnoses    Encounter for preventative adult health care exam with abnormal findings    -  Primary   Relevant Orders   CBC (Completed)   Comprehensive metabolic panel (Completed)   TSH (Completed)   Lipid panel (Completed)   Encounter for lipid screening for cardiovascular disease       Relevant Orders   Lipid panel (Completed)       Follow up: Return if symptoms worsen or fail to improve.  Alysia Pennaharlotte Akai Dollard, NP

## 2018-06-28 ENCOUNTER — Encounter: Payer: Self-pay | Admitting: Nurse Practitioner

## 2018-06-28 ENCOUNTER — Other Ambulatory Visit (INDEPENDENT_AMBULATORY_CARE_PROVIDER_SITE_OTHER): Payer: 59

## 2018-06-28 DIAGNOSIS — Z13220 Encounter for screening for lipoid disorders: Secondary | ICD-10-CM

## 2018-06-28 DIAGNOSIS — Z0001 Encounter for general adult medical examination with abnormal findings: Secondary | ICD-10-CM

## 2018-06-28 DIAGNOSIS — Z136 Encounter for screening for cardiovascular disorders: Secondary | ICD-10-CM

## 2018-06-28 LAB — CBC
HCT: 39.3 % (ref 36.0–46.0)
Hemoglobin: 13.3 g/dL (ref 12.0–15.0)
MCHC: 33.8 g/dL (ref 30.0–36.0)
MCV: 96.2 fl (ref 78.0–100.0)
Platelets: 308 10*3/uL (ref 150.0–400.0)
RBC: 4.08 Mil/uL (ref 3.87–5.11)
RDW: 12.1 % (ref 11.5–15.5)
WBC: 6.4 10*3/uL (ref 4.0–10.5)

## 2018-06-28 LAB — COMPREHENSIVE METABOLIC PANEL
ALBUMIN: 4 g/dL (ref 3.5–5.2)
ALT: 9 U/L (ref 0–35)
AST: 13 U/L (ref 0–37)
Alkaline Phosphatase: 45 U/L (ref 39–117)
BILIRUBIN TOTAL: 0.5 mg/dL (ref 0.2–1.2)
BUN: 14 mg/dL (ref 6–23)
CO2: 27 mEq/L (ref 19–32)
CREATININE: 0.67 mg/dL (ref 0.40–1.20)
Calcium: 9.3 mg/dL (ref 8.4–10.5)
Chloride: 105 mEq/L (ref 96–112)
GFR: 125.34 mL/min (ref >60.00)
Glucose, Bld: 105 mg/dL — ABNORMAL HIGH (ref 70–99)
Potassium: 4 mEq/L (ref 3.5–5.1)
SODIUM: 139 meq/L (ref 135–145)
TOTAL PROTEIN: 7.1 g/dL (ref 6.0–8.3)

## 2018-06-28 LAB — LIPID PANEL
CHOLESTEROL: 177 mg/dL (ref 0–200)
HDL: 40 mg/dL (ref >39.00)
LDL Cholesterol: 125 mg/dL — ABNORMAL HIGH (ref 0–99)
NonHDL: 137.41
TRIGLYCERIDES: 60 mg/dL (ref 0.0–149.0)
Total CHOL/HDL Ratio: 4
VLDL: 12 mg/dL (ref 0.0–40.0)

## 2018-06-28 LAB — TSH: TSH: 0.86 u[IU]/mL (ref 0.35–4.50)

## 2018-07-02 ENCOUNTER — Other Ambulatory Visit: Payer: 59

## 2018-07-04 ENCOUNTER — Other Ambulatory Visit: Payer: 59

## 2018-07-04 ENCOUNTER — Ambulatory Visit
Admission: RE | Admit: 2018-07-04 | Discharge: 2018-07-04 | Disposition: A | Discharge status: Home / Self Care | Payer: 59 | Source: Ambulatory Visit | Attending: Nurse Practitioner | Admitting: Nurse Practitioner

## 2018-07-04 DIAGNOSIS — R1905 Periumbilic swelling, mass or lump: Secondary | ICD-10-CM

## 2018-07-04 DIAGNOSIS — K429 Umbilical hernia without obstruction or gangrene: Secondary | ICD-10-CM | POA: Diagnosis not present

## 2018-07-23 ENCOUNTER — Ambulatory Visit: Payer: 59 | Admitting: Family Medicine

## 2018-07-23 ENCOUNTER — Encounter: Payer: Self-pay | Admitting: Family Medicine

## 2018-07-23 VITALS — BP 120/70 | HR 77 | Temp 98.6°F | Ht 63.0 in | Wt 163.1 lb

## 2018-07-23 DIAGNOSIS — B349 Viral infection, unspecified: Secondary | ICD-10-CM | POA: Diagnosis not present

## 2018-07-23 MED ORDER — BENZONATATE 200 MG PO CAPS: 200.0000 mg | ORAL_CAPSULE | Freq: Two times a day (BID) | ORAL | 0 refills | Status: DC | PRN

## 2018-07-23 MED ORDER — SALINE SPRAY 0.65 % NA SOLN: 1.0000 | NASAL | 2 refills | Status: DC | PRN

## 2018-07-23 NOTE — Patient Instructions (Signed)
Viral Respiratory Infection  A respiratory infection is an illness that affects part of the respiratory system, such as the lungs, nose, or throat. A respiratory infection that is caused by a virus is called a viral respiratory infection.  Common types of viral respiratory infections include:  · A cold.  · The flu (influenza).  · A respiratory syncytial virus (RSV) infection.  What are the causes?  This condition is caused by a virus.  What are the signs or symptoms?  Symptoms of this condition include:  · A stuffy or runny nose.  · Yellow or green nasal discharge.  · A cough.  · Sneezing.  · Fatigue.  · Achy muscles.  · A sore throat.  · Sweating or chills.  · A fever.  · A headache.  How is this diagnosed?  This condition may be diagnosed based on:  · Your symptoms.  · A physical exam.  · Testing of nasal swabs.  How is this treated?  This condition may be treated with medicines, such as:  · Antiviral medicine. This may shorten the length of time a person has symptoms.  · Expectorants. These make it easier to cough up mucus.  · Decongestant nasal sprays.  · Acetaminophen or NSAIDs to relieve fever and pain.  Antibiotic medicines are not prescribed for viral infections. This is because antibiotics are designed to kill bacteria. They are not effective against viruses.  Follow these instructions at home:    Managing pain and congestion  · Take over-the-counter and prescription medicines only as told by your health care provider.  · If you have a sore throat, gargle with a salt-water mixture 3-4 times a day or as needed. To make a salt-water mixture, completely dissolve ½-1 tsp of salt in 1 cup of warm water.  · Use nose drops made from salt water to ease congestion and soften raw skin around your nose.  · Drink enough fluid to keep your urine pale yellow. This helps prevent dehydration and helps loosen up mucus.  General instructions  · Rest as much as possible.  · Do not drink alcohol.  · Do not use any products  that contain nicotine or tobacco, such as cigarettes and e-cigarettes. If you need help quitting, ask your health care provider.  · Keep all follow-up visits as told by your health care provider. This is important.  How is this prevented?    · Get an annual flu shot. You may get the flu shot in late summer, fall, or winter. Ask your health care provider when you should get your flu shot.  · Avoid exposing others to your respiratory infection.  ? Stay home from work or school as told by your health care provider.  ? Wash your hands with soap and water often, especially after you cough or sneeze. If soap and water are not available, use alcohol-based hand sanitizer.  · Avoid contact with people who are sick during cold and flu season. This is generally fall and winter.  Contact a health care provider if:  · Your symptoms last for 10 days or longer.  · Your symptoms get worse over time.  · You have a fever.  · You have severe sinus pain in your face or forehead.  · The glands in your jaw or neck become very swollen.  Get help right away if you:  · Feel pain or pressure in your chest.  · Have shortness of breath.  · Faint or feel like   you will faint.  · Have severe and persistent vomiting.  · Feel confused or disoriented.  Summary  · A respiratory infection is an illness that affects part of the respiratory system, such as the lungs, nose, or throat. A respiratory infection that is caused by a virus is called a viral respiratory infection.  · Common types of viral respiratory infections are a cold, influenza, and respiratory syncytial virus (RSV) infection.  · Symptoms of this condition include a stuffy or runny nose, cough, sneezing, fatigue, achy muscles, sore throat, and fevers or chills.  · Antibiotic medicines are not prescribed for viral infections. This is because antibiotics are designed to kill bacteria. They are not effective against viruses.  This information is not intended to replace advice given to you by  your health care provider. Make sure you discuss any questions you have with your health care provider.  Document Released: 03/15/2005 Document Revised: 07/16/2017 Document Reviewed: 07/16/2017  Elsevier Interactive Patient Education © 2019 [employer]

## 2018-07-23 NOTE — Progress Notes (Signed)
Established Patient Office Visit  Subjective:  Patient ID: Renee Drake, female    DOB: 1976/05/12  Age: 43 y.o. MRN: [id number]  CC:  Chief Complaint  Patient presents with  . Cough    since saturday, productive, sore throat, son's daycare closed due to 10 cases of flu, no fever.    HPI Renee Drake presents for evaluation and treatment of a 3 to 4-day history of URI with nasal congestion postnasal drip sore throat cough.  Patient denies headache, fever chills, nausea and vomiting, myalgias or arthralgias.  Her 41-year-old son is sick at home with a cold.  Patient does not smoke or have a history of asthma.  Past Medical History:  Diagnosis Date  . Chicken pox   . HPV (human papilloma virus) anogenital infection   . Medical history non-contributory   . Postoperative anemia due to acute blood loss 12/02/2014    Past Surgical History:  Procedure Laterality Date  . CESAREAN SECTION N/A 12/01/2014   Procedure: CESAREAN SECTION;  Surgeon: Maxie BetterSheronette Cousins, MD;  Location: [facility];  Service: Obstetrics;  Laterality: N/A;  . NO PAST SURGERIES      Family History  Problem Relation Age of Onset  . Diabetes Mother   . Cancer Father        esophagel cancer  . Cancer Maternal Grandmother        breast cancer  . Diabetes Maternal Grandmother   . Diabetes Maternal Grandfather   . Heart disease Maternal Grandfather   . Cancer Paternal Grandmother        throat cancer  . Heart attack Paternal Grandfather     Social History   Socioeconomic History  . Marital status: Single    Spouse name: Not on file  . Number of children: 1  . Years of education: Not on file  . Highest education level: Not on file  Occupational History  . Not on file  Social Needs  . Financial resource strain: Not on file  . Food insecurity:    Worry: Not on file    Inability: Not on file  . Transportation needs:    Medical: Not on file    Non-medical: Not on file  Tobacco Use  . Smoking  status: Never Smoker  . Smokeless tobacco: Never Used  Substance and Sexual Activity  . Alcohol use: Yes    Comment: social  . Drug use: No  . Sexual activity: Yes    Birth control/protection: None  Lifestyle  . Physical activity:    Days per week: Not on file    Minutes per session: Not on file  . Stress: Not on file  Relationships  . Social connections:    Talks on phone: Not on file    Gets together: Not on file    Attends religious service: Not on file    Active member of club or organization: Not on file    Attends meetings of clubs or organizations: Not on file    Relationship status: Not on file  . Intimate partner violence:    Fear of current or ex partner: Not on file    Emotionally abused: Not on file    Physically abused: Not on file    Forced sexual activity: Not on file  Other Topics Concern  . Not on file  Social History Narrative  . Not on file    Outpatient Medications Prior to Visit  Medication Sig Dispense Refill  . Prenatal Vit-Fe Fumarate-FA (PRENATAL  MULTIVITAMIN) TABS tablet Take 1 tablet by mouth daily at 12 noon.     No facility-administered medications prior to visit.     No Known Allergies  ROS Review of Systems  Constitutional: Negative for chills, fatigue, fever and unexpected weight change.  HENT: Positive for congestion, postnasal drip, rhinorrhea and sore throat. Negative for dental problem, ear pain, sinus pressure and sinus pain.   Eyes: Negative for photophobia and visual disturbance.  Respiratory: Positive for cough. Negative for shortness of breath and wheezing.   Cardiovascular: Negative.   Gastrointestinal: Negative.   Genitourinary: Negative.   Musculoskeletal: Negative for arthralgias and myalgias.  Skin: Negative for pallor and rash.  Allergic/Immunologic: Negative for immunocompromised state.  Neurological: Negative for light-headedness and headaches.  Hematological: Does not bruise/bleed easily.  Psychiatric/Behavioral:  Negative.       Objective:    Physical Exam  Constitutional: She is oriented to person, place, and time. She appears well-developed and well-nourished. No distress.  HENT:  Head: Normocephalic and atraumatic.  Right Ear: External ear normal.  Left Ear: External ear normal.  Mouth/Throat: Oropharynx is clear and moist. No oropharyngeal exudate.  Eyes: Pupils are equal, round, and reactive to light. Conjunctivae and EOM are normal. Right eye exhibits no discharge. Left eye exhibits no discharge. No scleral icterus.  Neck: Neck supple. No JVD present. No tracheal deviation present. No thyromegaly present.  Cardiovascular: Normal rate, regular rhythm and normal heart sounds.  Pulmonary/Chest: Effort normal and breath sounds normal. No stridor.  Abdominal: Bowel sounds are normal.  Lymphadenopathy:    She has no cervical adenopathy.  Neurological: She is alert and oriented to person, place, and time.  Skin: Skin is warm and dry. She is not diaphoretic.  Psychiatric: She has a normal mood and affect. Her behavior is normal.    BP 120/70   Pulse 77   Temp 98.6 F (37 C) (Oral)   Ht 5\' 3"  (1.6 m)   Wt 163 lb 2 oz (74 kg)   SpO2 100%   BMI 28.90 kg/m  Wt Readings from Last 3 Encounters:  07/23/18 163 lb 2 oz (74 kg)  06/27/18 161 lb 12.8 oz (73.4 kg)  11/30/14 179 lb (81.2 kg)   BP Readings from Last 3 Encounters:  07/23/18 120/70  06/27/18 124/90  12/04/14 (!) 100/52   Guideline developer:  UpToDate (see UpToDate for funding source) Date Released: June 2014  Health Maintenance Due  Topic Date Due  . PAP SMEAR-Modifier  06/13/1999    There are no preventive care reminders to display for this patient.  Lab Results  Component Value Date   TSH 0.86 06/28/2018   Lab Results  Component Value Date   WBC 6.4 06/28/2018   HGB 13.3 06/28/2018   HCT 39.3 06/28/2018   MCV 96.2 06/28/2018   PLT 308.0 06/28/2018   Lab Results  Component Value Date   NA 139 06/28/2018    K 4.0 06/28/2018   CO2 27 06/28/2018   GLUCOSE 105 (H) 06/28/2018   BUN 14 06/28/2018   CREATININE 0.67 06/28/2018   BILITOT 0.5 06/28/2018   ALKPHOS 45 06/28/2018   AST 13 06/28/2018   ALT 9 06/28/2018   PROT 7.1 06/28/2018   ALBUMIN 4.0 06/28/2018   CALCIUM 9.3 06/28/2018   GFR 125.34 06/28/2018   Lab Results  Component Value Date   CHOL 177 06/28/2018   Lab Results  Component Value Date   HDL 40.00 06/28/2018   Lab Results  Component Value  Date   LDLCALC 125 (H) 06/28/2018   Lab Results  Component Value Date   TRIG 60.0 06/28/2018   Lab Results  Component Value Date   CHOLHDL 4 06/28/2018   No results found for: HGBA1C    Assessment & Plan:   Problem List Items Addressed This Visit      Other   Viral syndrome - Primary   Relevant Medications   benzonatate (TESSALON) 200 MG capsule   sodium chloride (OCEAN) 0.65 % SOLN nasal spray      Meds ordered this encounter  Medications  . benzonatate (TESSALON) 200 MG capsule    Sig: Take 1 capsule (200 mg total) by mouth 2 (two) times daily as needed for cough.    Dispense:  20 capsule    Refill:  0  . sodium chloride (OCEAN) 0.65 % SOLN nasal spray    Sig: Place 1 spray into both nostrils as needed for congestion.    Dispense:  1 Bottle    Refill:  2    Follow-up: Return in about 5 days (around 07/28/2018), or if symptoms worsen or fail to improve.   Patient was given information on viral upper respiratory tract infections.  She will follow-up in 4 to 5 days if not improving on her own.

## 2018-08-20 DIAGNOSIS — Z719 Counseling, unspecified: Secondary | ICD-10-CM | POA: Diagnosis not present

## 2018-10-07 ENCOUNTER — Telehealth: Payer: Self-pay | Admitting: Nurse Practitioner

## 2018-10-07 NOTE — Telephone Encounter (Signed)
I called and left message on patient voicemail per Alysia Penna request to schedule appointment for CPE.

## 2019-11-25 ENCOUNTER — Ambulatory Visit: Payer: Self-pay | Attending: Family

## 2019-11-25 DIAGNOSIS — Z23 Encounter for immunization: Secondary | ICD-10-CM

## 2019-11-25 NOTE — Progress Notes (Signed)
   Covid-19 Vaccination Clinic  Name:  Renee Drake    MRN: [id number] DOB: April 10, 1976  11/25/2019  [name] was observed post Covid-19 immunization for 15 minutes without incident. She was provided with Vaccine Information Sheet and instruction to access the V-Safe system.   [name] was instructed to call 911 with any severe reactions post vaccine: Marland Kitchen Difficulty breathing  . Swelling of face and throat  . A fast heartbeat  . A bad rash all over body  . Dizziness and weakness   Immunizations Administered    Name Date Dose VIS Date Route   Pfizer COVID-19 Vaccine 11/25/2019  2:51 PM 0.3 mL 08/13/2018 Intramuscular   Manufacturer: [employer], [employer]   Lot: [id number]   NDC: [address]-8

## 2019-11-27 ENCOUNTER — Ambulatory Visit: Payer: Self-pay

## 2019-12-16 ENCOUNTER — Ambulatory Visit: Payer: Medicaid Other | Attending: Internal Medicine

## 2019-12-16 DIAGNOSIS — Z23 Encounter for immunization: Secondary | ICD-10-CM

## 2019-12-16 NOTE — Progress Notes (Signed)
   Covid-19 Vaccination Clinic  Name:  Renee Drake    MRN: [id number] DOB: Apr 19, 1976  12/16/2019  Renee Drake was observed post Covid-19 immunization for 15 minutes without incident. She was provided with Vaccine Information Sheet and instruction to access the V-Safe system.   Renee Drake was instructed to call 911 with any severe reactions post vaccine: Marland Kitchen Difficulty breathing  . Swelling of face and throat  . A fast heartbeat  . A bad rash all over body  . Dizziness and weakness   Immunizations Administered    Name Date Dose VIS Date Route   Pfizer COVID-19 Vaccine 12/16/2019  1:31 PM 0.3 mL 08/13/2018 Intramuscular   Manufacturer: [employer], [employer]   Lot: [id number]   NDC: [address]-2

## 2020-07-07 IMAGING — US US ABDOMEN LIMITED
1 series · 14 of 16 positions shown · non-contrast
Comparison: None.

CLINICAL DATA: Midline abdominal soft tissue mass above the
umbilicus detected on physical exam.

EXAM:
ULTRASOUND ABDOMEN LIMITED

[Series 1: us abdomen limited · 0.07mm/px · 16 acquisitions, 14 frames shown]
[im 1/16]
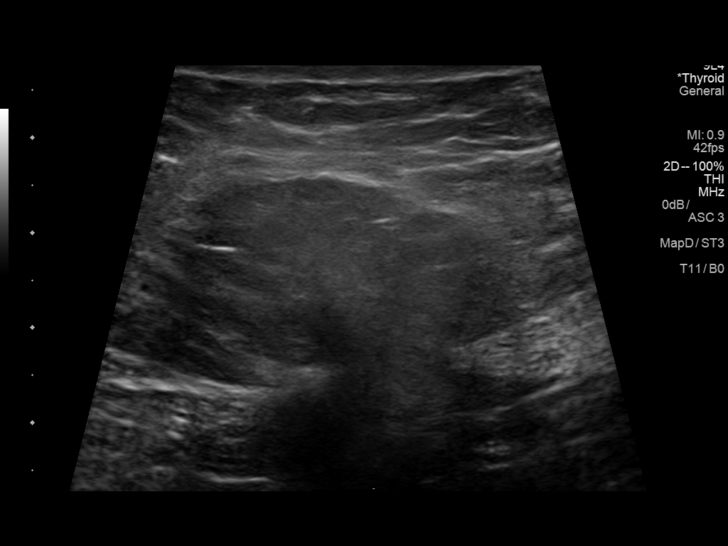
[im 2/16]
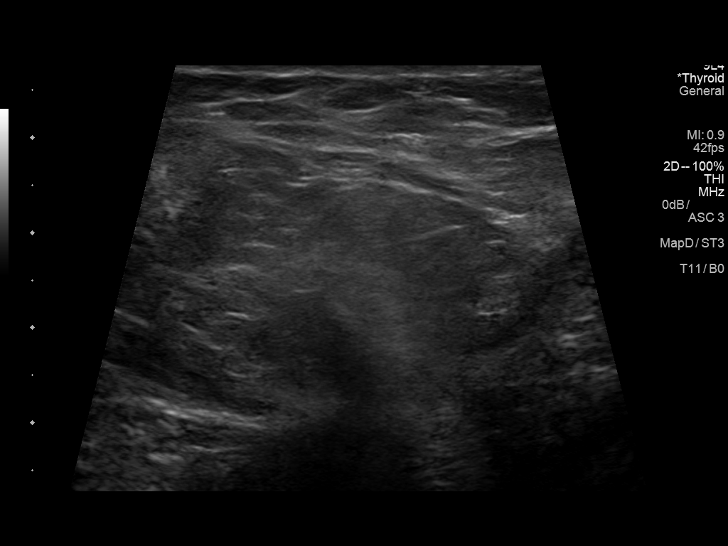
[im 3/16]
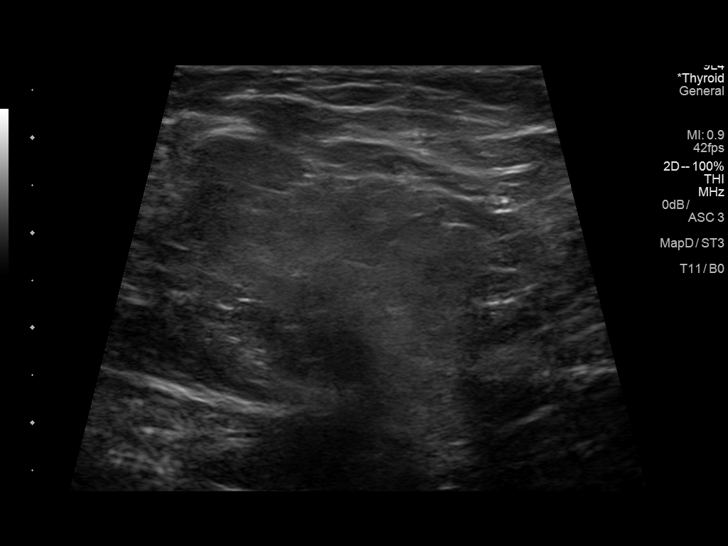
[im 5/16]
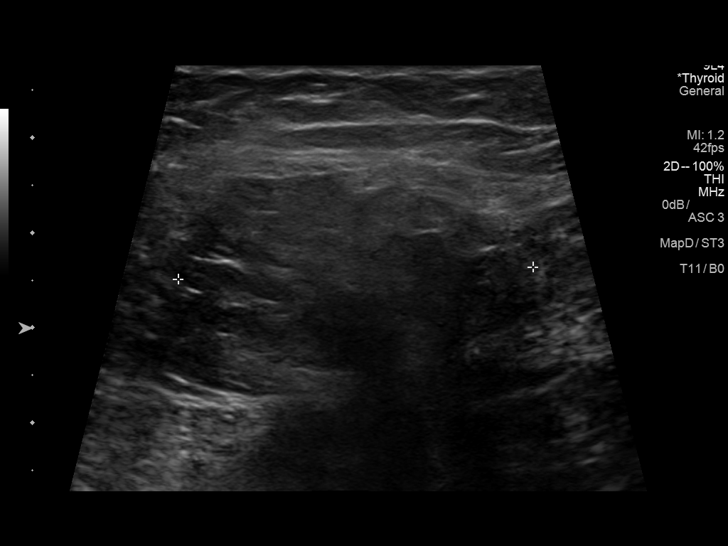
[im 6/16]
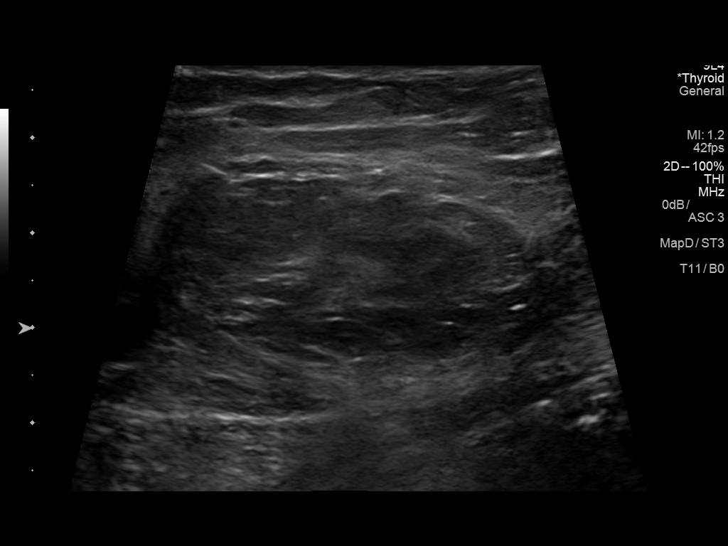
[im 7/16]
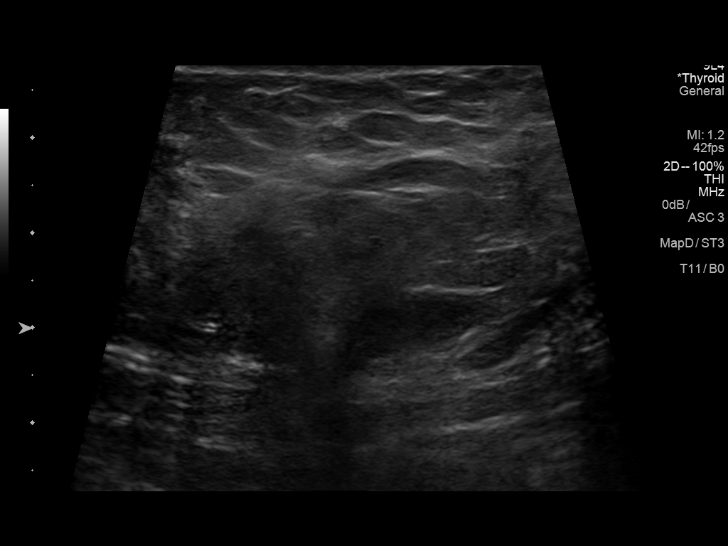
[im 8/16]
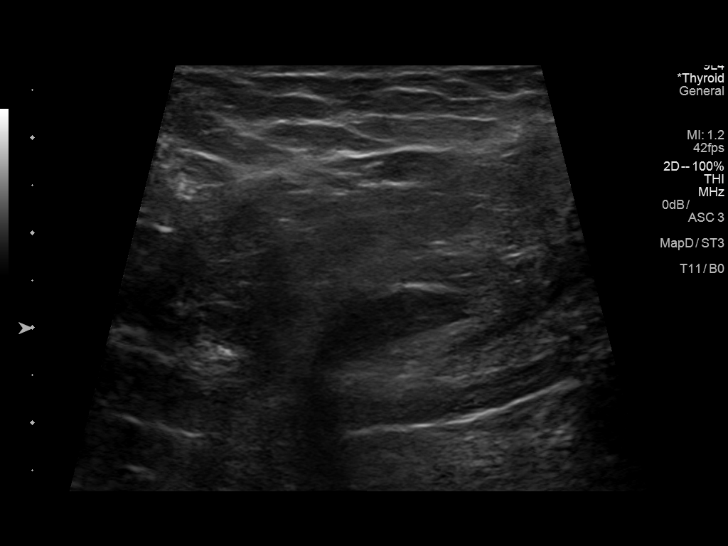
[im 9/16]
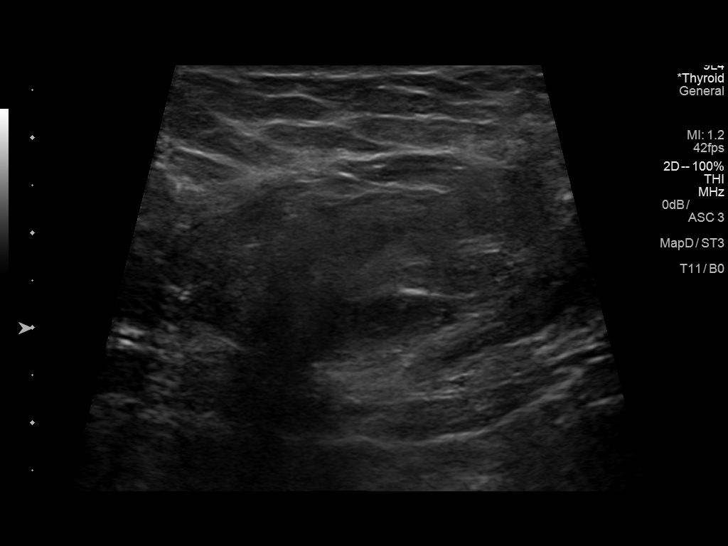
[im 10/16]
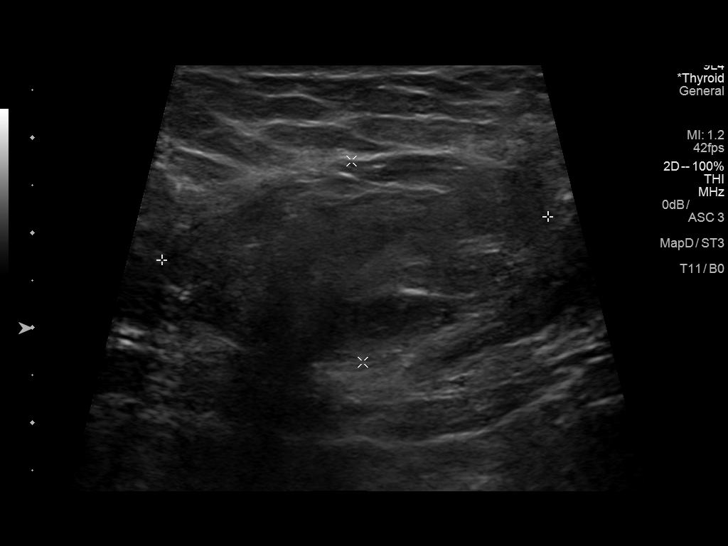
[im 11/16]
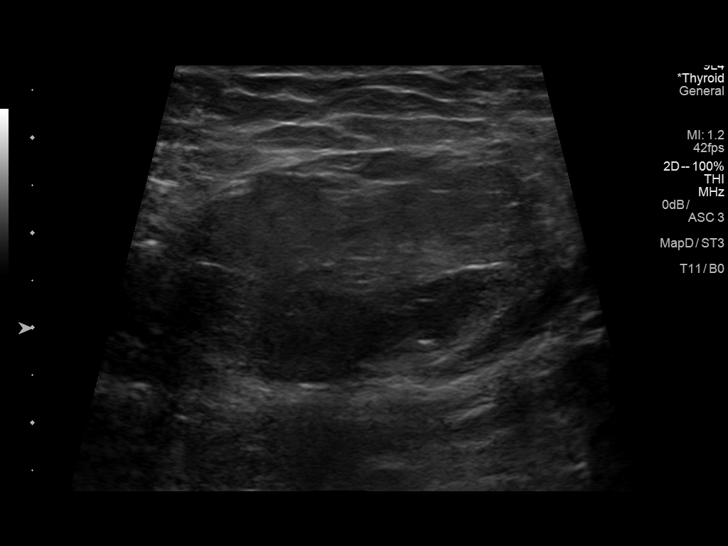
[im 13/16]
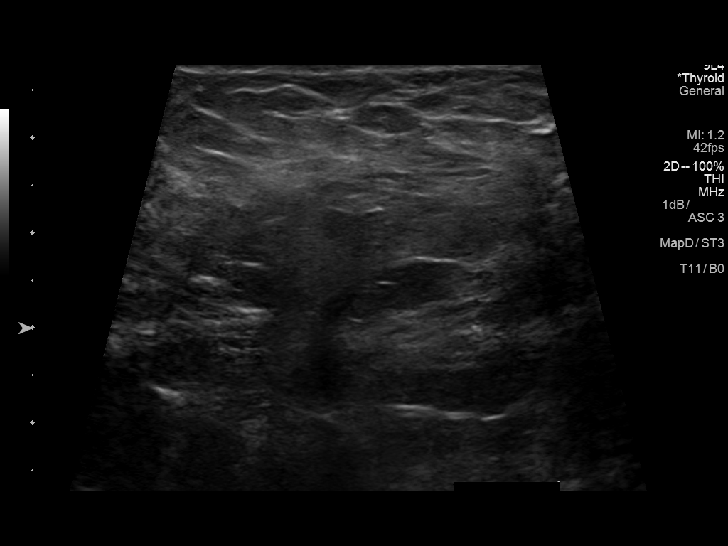
[im 14/16]
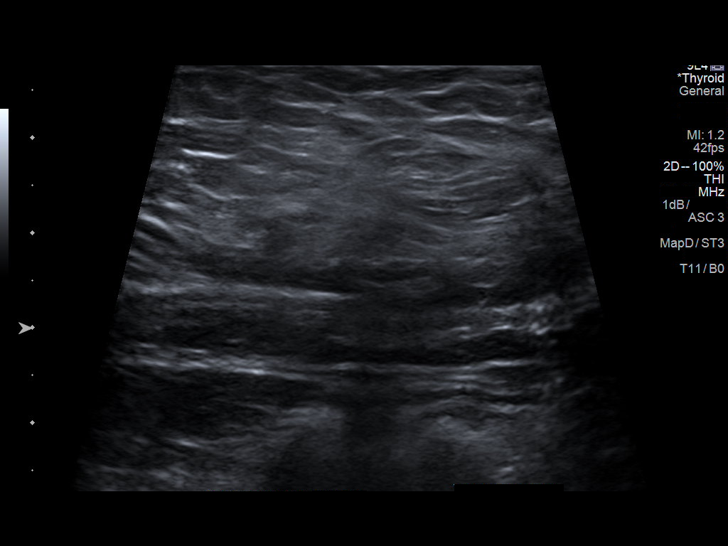
[im 15/16]
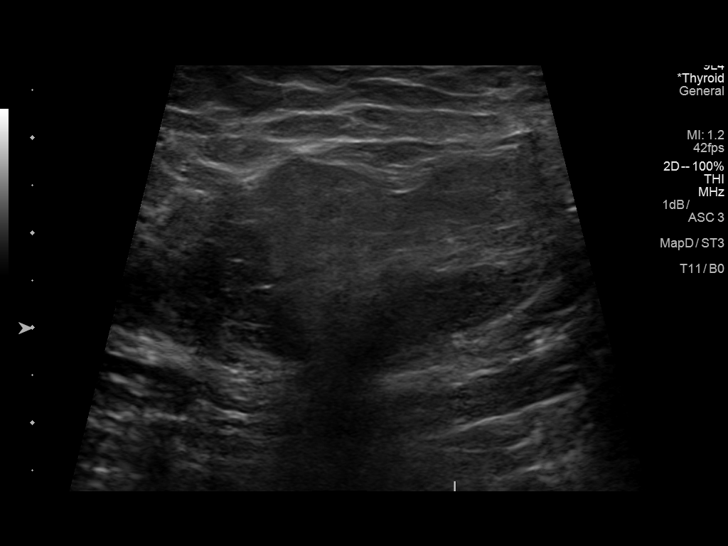
[im 16/16]
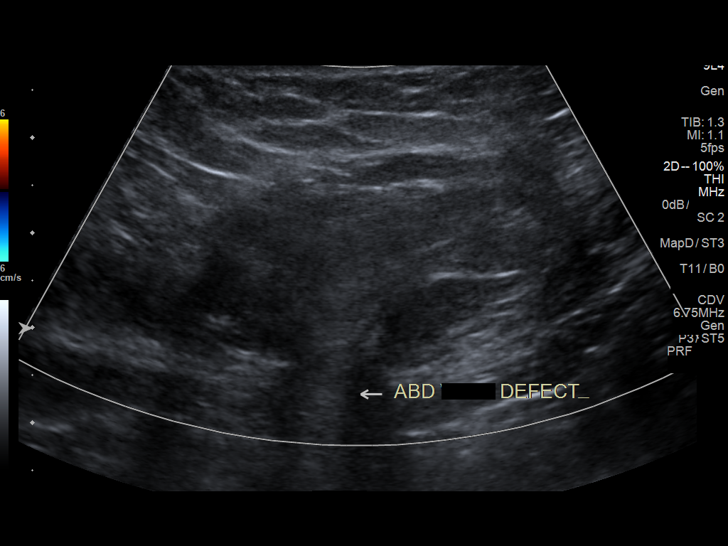

[14 of 16 positions shown; findings below may reference images not displayed]

FINDINGS: There is a 4.1 x 3.7 x 2.1 cm periumbilical hernia containing only
fat. The defect in the anterior abdominal wall is approximately 13
mm.

No other abnormality.
IMPRESSION: Periumbilical hernia containing only fat, as described.

## 2021-08-05 LAB — HM PAP SMEAR
Chlamydia, Swab/Urine, PCR: NEGATIVE
HPV, high-risk: NEGATIVE

## 2022-07-18 ENCOUNTER — Other Ambulatory Visit: Payer: Self-pay | Admitting: Family Medicine

## 2022-07-18 DIAGNOSIS — Z1231 Encounter for screening mammogram for malignant neoplasm of breast: Secondary | ICD-10-CM

## 2023-08-17 LAB — HM MAMMOGRAPHY

## 2023-08-21 ENCOUNTER — Encounter: Payer: Self-pay | Admitting: Nurse Practitioner

## 2023-09-17 ENCOUNTER — Ambulatory Visit: Payer: 59 | Admitting: Nurse Practitioner

## 2023-09-17 ENCOUNTER — Encounter: Payer: Self-pay | Admitting: Nurse Practitioner

## 2023-09-17 VITALS — BP 128/78 | HR 72 | Temp 98.6°F | Ht 63.0 in | Wt 162.0 lb

## 2023-09-17 DIAGNOSIS — E663 Overweight: Secondary | ICD-10-CM

## 2023-09-17 DIAGNOSIS — Z1211 Encounter for screening for malignant neoplasm of colon: Secondary | ICD-10-CM

## 2023-09-17 DIAGNOSIS — Z1159 Encounter for screening for other viral diseases: Secondary | ICD-10-CM

## 2023-09-17 DIAGNOSIS — Z862 Personal history of diseases of the blood and blood-forming organs and certain disorders involving the immune mechanism: Secondary | ICD-10-CM | POA: Diagnosis not present

## 2023-09-17 DIAGNOSIS — Z974 Presence of external hearing-aid: Secondary | ICD-10-CM

## 2023-09-17 DIAGNOSIS — Z6828 Body mass index (BMI) 28.0-28.9, adult: Secondary | ICD-10-CM

## 2023-09-17 DIAGNOSIS — H6121 Impacted cerumen, right ear: Secondary | ICD-10-CM

## 2023-09-17 DIAGNOSIS — Z0000 Encounter for general adult medical examination without abnormal findings: Secondary | ICD-10-CM

## 2023-09-17 DIAGNOSIS — Z7689 Persons encountering health services in other specified circumstances: Secondary | ICD-10-CM

## 2023-09-17 DIAGNOSIS — Z13228 Encounter for screening for other metabolic disorders: Secondary | ICD-10-CM

## 2023-09-17 DIAGNOSIS — Z13220 Encounter for screening for lipoid disorders: Secondary | ICD-10-CM

## 2023-09-17 DIAGNOSIS — Z2821 Immunization not carried out because of patient refusal: Secondary | ICD-10-CM

## 2023-09-17 NOTE — Patient Instructions (Addendum)
 Health Maintenance  Topic Date Due   Hepatitis C Screening  Never done   Pap with HPV screening  Never done   Flu Shot  09/17/2023*   COVID-19 Vaccine (3 - Pfizer risk series) 10/03/2023*   Colon Cancer Screening  09/16/2024*   DTaP/Tdap/Td vaccine (2 - Td or Tdap) 06/19/2024   HIV Screening  Completed   HPV Vaccine  Aged Out  *Topic was postponed. The date shown is not the original due date.   Goal to exercise 150 minutes per week with at least 2 days of strength training, incorporate with your Zumba Encouraged to park further when at the store, take stairs instead of elevators and to walk in place during commercials. Increase water intake to at least one gallon of water daily.  Nice to meet you!

## 2023-09-17 NOTE — Progress Notes (Addendum)
 [place], CMA,acting as a Neurosurgeon for Susanna Epley, FNP.,have documented all relevant documentation on the behalf of Susanna Epley, FNP,as directed by  Susanna Epley, FNP while in the presence of Susanna Epley, FNP.  Subjective:    Patient ID: Renee Drake , female    DOB: [birth year] , 48 y.o.   MRN: [id number]  Chief Complaint  Patient presents with   Establish Care    HPI  Patient presents today to establish care and  HM. She was referred by a co-worker. She was seeing [facility] Physician Dr. Geralyn Knee who relocated.  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 Single. One child - 43 years old son - healthy. Patient reports compliance with medication. Patient denies any chest pain, SOB, or headaches. Patient has no concerns today. She had a PAP with Dr. Lesta Rater in 2023.   Father - esophageal cancer - deceased Mother - type 2 diabetes Brother - healthy  Patient hasn't been seen by PCP since January of 2024.     Past Medical History:  Diagnosis Date   Chicken pox    HPV (human papilloma virus) anogenital infection    Medical history non-contributory    Postoperative anemia due to acute blood loss 12/02/2014     Family History  Problem Relation Age of Onset   Diabetes Mother    Cancer Father        esophagel cancer   Cancer Maternal Grandmother        breast cancer   Diabetes Maternal Grandmother    Diabetes Maternal Grandfather    Heart disease Maternal Grandfather    Cancer Paternal Grandmother        throat cancer   Heart attack Paternal Grandfather    Heart disease Paternal Grandfather      Current Outpatient Medications:    Multiple Vitamin (MULTIVITAMIN) tablet, Take 1 tablet by mouth daily., Disp: , Rfl:    No Known Allergies    The patient states she uses none for birth control. Patient's last menstrual period was 09/07/2023.. Negative for Dysmenorrhea and Negative for Menorrhagia. Negative for: breast discharge, breast lump(s),  breast pain and breast self exam. Associated symptoms include abnormal vaginal bleeding. Pertinent negatives include abnormal bleeding (hematology), anxiety, decreased libido, depression, difficulty falling sleep, dyspareunia, history of infertility, nocturia, sexual dysfunction, sleep disturbances, urinary incontinence, urinary urgency, vaginal discharge and vaginal itching. Diet regular; eats vegetables, eats fruits, she does drink water daily approximately 40 oz daily.  She has not had any sodas or sweets since beginning lint. The patient states her exercise level is Zumba once a week, barriers is scheduling with her 46 year old.   The patient's tobacco use is:  Social History   Tobacco Use  Smoking Status Never  Smokeless Tobacco Never   She has been exposed to passive smoke. The patient's alcohol use is:  Social History   Substance and Sexual Activity  Alcohol Use Not Currently   Comment: social   Additional information: Last pap 2023, next one scheduled for 2026.    Review of Systems  Constitutional: Negative.   HENT:  Positive for hearing loss.   Eyes: Negative.   Respiratory: Negative.    Cardiovascular: Negative.   Gastrointestinal: Negative.   Endocrine: Negative.   Genitourinary: Negative.   Musculoskeletal: Negative.   Skin: Negative.   Allergic/Immunologic: Negative.   Neurological: Negative.   Hematological: Negative.   Psychiatric/Behavioral: Negative.       Today's Vitals  09/17/23 1410  BP: 128/78  Pulse: 72  Temp: 98.6 F (37 C)  TempSrc: Oral  Weight: 162 lb (73.5 kg)  Height: 5\' 3"  (1.6 m)  PainSc: 0-No pain   Body mass index is 28.7 kg/m.  Wt Readings from Last 3 Encounters:  09/17/23 162 lb (73.5 kg)  07/23/18 163 lb 2 oz (74 kg)  06/27/18 161 lb 12.8 oz (73.4 kg)     Objective:  Physical Exam Vitals and nursing note reviewed.  Constitutional:      General: She is not in acute distress.    Appearance: Normal appearance. She is  well-developed.  HENT:     Head: Normocephalic and atraumatic.     Right Ear: External ear normal. Decreased hearing noted. There is impacted cerumen.     Left Ear: Hearing, tympanic membrane, ear canal and external ear normal. There is no impacted cerumen.     Nose: Nose normal.     Mouth/Throat:     Mouth: Mucous membranes are moist.  Eyes:     General: Lids are normal.     Extraocular Movements: Extraocular movements intact.     Conjunctiva/sclera: Conjunctivae normal.     Pupils: Pupils are equal, round, and reactive to light.     Funduscopic exam:    Right eye: No papilledema.        Left eye: No papilledema.  Neck:     Thyroid: No thyroid mass.     Vascular: No carotid bruit.  Cardiovascular:     Rate and Rhythm: Normal rate and regular rhythm.     Pulses: Normal pulses.     Heart sounds: Normal heart sounds. No murmur heard. Pulmonary:     Effort: Pulmonary effort is normal.     Breath sounds: Normal breath sounds.  Chest:     Chest wall: No mass.  Breasts:    Tanner Score is 5.     Right: Normal. No mass or tenderness.     Left: Normal. No mass or tenderness.  Abdominal:     General: Abdomen is flat. Bowel sounds are normal. There is no distension.     Palpations: Abdomen is soft.     Tenderness: There is no abdominal tenderness.  Genitourinary:    Rectum: Guaiac result negative.  Musculoskeletal:        General: No swelling. Normal range of motion.     Cervical back: Full passive range of motion without pain, normal range of motion and neck supple.     Right lower leg: No edema.     Left lower leg: No edema.  Lymphadenopathy:     Upper Body:     Right upper body: No supraclavicular, axillary or pectoral adenopathy.     Left upper body: No supraclavicular, axillary or pectoral adenopathy.  Skin:    General: Skin is warm and dry.     Capillary Refill: Capillary refill takes less than 2 seconds.  Neurological:     General: No focal deficit present.     Mental  Status: She is alert and oriented to person, place, and time.     Cranial Nerves: No cranial nerve deficit.     Sensory: No sensory deficit.  Psychiatric:        Mood and Affect: Mood normal.        Behavior: Behavior normal.        Thought Content: Thought content normal.        Judgment: Judgment normal.  Assessment And Plan:     Establishing care with new doctor, encounter for Assessment & Plan: Patient is here to establish care. Went over patient medical, family, social and surgical history. Reviewed with patient their medications and any allergies  Reviewed with patient their sexual orientation, drug/tobacco and alcohol use Dicussed any new concerns with patient  Advised patient to eat a healthy diet along with exercise for atleast 30-45 min atleast 4-5 days of the week.    Orders: -     [id number]+EGFR  Encounter for annual health examination Assessment & Plan: Behavior modifications discussed and diet history reviewed.   Pt will continue to exercise regularly and modify diet with low GI, plant based foods and decrease intake of processed foods.  Recommend intake of daily multivitamin, Vitamin D, and calcium.  Recommend mammogram and colonoscopy for preventive screenings, as well as recommend immunizations that include influenza, TDAP, and Shingles    COVID-19 vaccination declined Assessment & Plan: Declines covid 19 vaccine. Discussed risk of covid 26 and if she changes her mind about the vaccine to call the office. Education has been provided regarding the importance of this vaccine but patient still declined. Advised may receive this vaccine at local pharmacy or Health Dept.or vaccine clinic. Aware to provide a copy of the vaccination record if obtained from local pharmacy or Health Dept.  Encouraged to take multivitamin, vitamin d, vitamin c and zinc to increase immune system. Aware can call office if would like to have vaccine here at office. Verbalized acceptance  and understanding.    Influenza vaccination declined Assessment & Plan: Patient declined influenza vaccination at this time. Patient is aware that influenza vaccine prevents illness in 70% of healthy people, and reduces hospitalizations to 30-70% in elderly. This vaccine is recommended annually. Education has been provided regarding the importance of this vaccine but patient still declined. Advised may receive this vaccine at local pharmacy or Health Dept.or vaccine clinic. Aware to provide a copy of the vaccination record if obtained from local pharmacy or Health Dept.  Pt is willing to accept risk associated with refusing vaccination.    Overweight with body mass index (BMI) of 28 to 28.9 in adult  History of anemia -     CBC with Differential/Platelet  Encounter for screening for metabolic disorder -     Hemoglobin A1c  Encounter for screening for lipid disorder -     Lipid panel  Encounter for hepatitis C screening test for low risk patient Assessment & Plan: Will check Hepatitis C screening due to recent recommendations to screen all adults 18 years and older   Orders: -     Hepatitis C antibody  Encounter for screening colonoscopy Assessment & Plan: According to USPTF Colorectal cancer Screening guidelines. Colonoscopy is recommended every 10 years, starting at age 28 years. Will refer to GI for colon cancer screening.   Orders: -     Ambulatory referral to Gastroenterology  Hearing loss of right ear due to cerumen impaction Assessment & Plan: Wax is removed by with lavage with elephant ear with 1/2 water and 1/2 peroxide. Instructions for home care to prevent wax buildup are given.    Wears hearing aid in both ears   Advised if interested in having more children to call Dr. Lesta Rater as she is still an active patient to see if she can do any testing and to have a discussion about her chances and risk.  No follow-ups on file. Patient was given opportunity to ask  questions. Patient verbalized understanding of the plan and was able to repeat key elements of the plan. All questions were answered to their satisfaction.   Susanna Epley, FNP  I, Susanna Epley, FNP, have reviewed all documentation for this visit. The documentation on 09/17/23 for the exam, diagnosis, procedures, and orders are all accurate and complete.

## 2023-09-18 ENCOUNTER — Encounter: Payer: Self-pay | Admitting: Nurse Practitioner

## 2023-09-18 LAB — LIPID PANEL
Chol/HDL Ratio: 4.6 ratio — ABNORMAL HIGH (ref 0.0–4.4)
Cholesterol, Total: 204 mg/dL — ABNORMAL HIGH (ref 100–199)
HDL: 44 mg/dL (ref >39)
LDL Chol Calc (NIH): 144 mg/dL — ABNORMAL HIGH (ref 0–99)
Triglycerides: 87 mg/dL (ref 0–149)
VLDL Cholesterol Cal: 16 mg/dL (ref 5–40)

## 2023-09-18 LAB — CMP14+EGFR
ALT: 11 IU/L (ref 0–32)
AST: 16 IU/L (ref 0–40)
Albumin: 4.4 g/dL (ref 3.9–4.9)
Alkaline Phosphatase: 57 IU/L (ref 44–121)
BUN/Creatinine Ratio: 12 (ref 9–23)
BUN: 9 mg/dL (ref 6–24)
Bilirubin Total: 0.8 mg/dL (ref 0.0–1.2)
CO2: 23 mmol/L (ref 20–29)
Calcium: 9.5 mg/dL (ref 8.7–10.2)
Chloride: 104 mmol/L (ref 96–106)
Creatinine, Ser: 0.73 mg/dL (ref 0.57–1.00)
Globulin, Total: 2.7 g/dL (ref 1.5–4.5)
Glucose: 85 mg/dL (ref 70–99)
Potassium: 4 mmol/L (ref 3.5–5.2)
Sodium: 142 mmol/L (ref 134–144)
Total Protein: 7.1 g/dL (ref 6.0–8.5)
eGFR: 103 mL/min/{1.73_m2} (ref >59)

## 2023-09-18 LAB — CBC WITH DIFFERENTIAL/PLATELET
Basophils Absolute: 0 10*3/uL (ref 0.0–0.2)
Basos: 0 %
EOS (ABSOLUTE): 0.1 10*3/uL (ref 0.0–0.4)
Eos: 1 %
Hematocrit: 40.3 % (ref 34.0–46.6)
Hemoglobin: 13.6 g/dL (ref 11.1–15.9)
Immature Grans (Abs): 0 10*3/uL (ref 0.0–0.1)
Immature Granulocytes: 0 %
Lymphocytes Absolute: 2.5 10*3/uL (ref 0.7–3.1)
Lymphs: 34 %
MCH: 32.2 pg (ref 26.6–33.0)
MCHC: 33.7 g/dL (ref 31.5–35.7)
MCV: 95 fL (ref 79–97)
Monocytes Absolute: 0.6 10*3/uL (ref 0.1–0.9)
Monocytes: 7 %
Neutrophils Absolute: 4.4 10*3/uL (ref 1.4–7.0)
Neutrophils: 58 %
Platelets: 325 10*3/uL (ref 150–450)
RBC: 4.23 x10E6/uL (ref 3.77–5.28)
RDW: 11 % — ABNORMAL LOW (ref 11.7–15.4)
WBC: 7.6 10*3/uL (ref 3.4–10.8)

## 2023-09-18 LAB — HEMOGLOBIN A1C
Est. average glucose Bld gHb Est-mCnc: 120 mg/dL
Hgb A1c MFr Bld: 5.8 % — ABNORMAL HIGH (ref 4.8–5.6)

## 2023-09-18 LAB — HEPATITIS C ANTIBODY: Hep C Virus Ab: NONREACTIVE

## 2023-09-27 DIAGNOSIS — H6121 Impacted cerumen, right ear: Secondary | ICD-10-CM | POA: Insufficient documentation

## 2023-09-27 DIAGNOSIS — Z974 Presence of external hearing-aid: Secondary | ICD-10-CM | POA: Insufficient documentation

## 2023-09-27 DIAGNOSIS — Z1159 Encounter for screening for other viral diseases: Secondary | ICD-10-CM | POA: Insufficient documentation

## 2023-09-27 DIAGNOSIS — Z0000 Encounter for general adult medical examination without abnormal findings: Secondary | ICD-10-CM | POA: Insufficient documentation

## 2023-09-27 DIAGNOSIS — Z1211 Encounter for screening for malignant neoplasm of colon: Secondary | ICD-10-CM | POA: Insufficient documentation

## 2023-09-27 DIAGNOSIS — Z2821 Immunization not carried out because of patient refusal: Secondary | ICD-10-CM | POA: Insufficient documentation

## 2023-09-27 DIAGNOSIS — Z7689 Persons encountering health services in other specified circumstances: Secondary | ICD-10-CM | POA: Insufficient documentation

## 2023-09-27 NOTE — Assessment & Plan Note (Signed)
 Will check Hepatitis C screening due to recent recommendations to screen all adults 18 years and older

## 2023-09-27 NOTE — Assessment & Plan Note (Signed)
 According to USPTF Colorectal cancer Screening guidelines. Colonoscopy is recommended every 10 years, starting at age 46 years. Will refer to GI for colon cancer screening.

## 2023-09-27 NOTE — Assessment & Plan Note (Signed)

## 2023-09-27 NOTE — Assessment & Plan Note (Signed)

## 2023-09-27 NOTE — Assessment & Plan Note (Signed)

## 2023-09-27 NOTE — Assessment & Plan Note (Signed)
 Patient is here to establish care. Went over patient medical, family, social and surgical history. Reviewed with patient their medications and any allergies  Reviewed with patient their sexual orientation, drug/tobacco and alcohol use Dicussed any new concerns with patient  Advised patient to eat a healthy diet along with exercise for atleast 30-45 min atleast 4-5 days of the week.

## 2023-10-01 NOTE — Assessment & Plan Note (Signed)
 Wax is removed by with lavage with elephant ear with 1/2 water and 1/2 peroxide. Instructions for home care to prevent wax buildup are given.

## 2023-10-01 NOTE — Progress Notes (Signed)
 Done

## 2023-11-27 ENCOUNTER — Encounter: Payer: Self-pay | Admitting: Nurse Practitioner

## 2023-11-27 ENCOUNTER — Other Ambulatory Visit: Payer: Self-pay | Admitting: Nurse Practitioner

## 2023-11-27 DIAGNOSIS — Z1211 Encounter for screening for malignant neoplasm of colon: Secondary | ICD-10-CM

## 2023-12-28 ENCOUNTER — Other Ambulatory Visit: Payer: Self-pay | Admitting: Medical Genetics

## 2023-12-28 DIAGNOSIS — Z006 Encounter for examination for normal comparison and control in clinical research program: Secondary | ICD-10-CM

## 2024-01-14 LAB — GENECONNECT MOLECULAR SCREEN: Genetic Analysis Overall Interpretation: NEGATIVE

## 2024-06-26 LAB — HM COLONOSCOPY
